# Patient Record
Sex: Male | Born: 1980 | Race: Asian | Hispanic: No | Marital: Married | Smoking: Never smoker
Health system: Southern US, Community
[De-identification: ages and names within clinical notes are randomized; demographics above are authoritative.]

---

## 2006-06-08 ENCOUNTER — Emergency Department (HOSPITAL_COMMUNITY): Admission: EM | Admit: 2006-06-08 | Discharge: 2006-06-08 | Payer: Self-pay | Admitting: Emergency Medicine

## 2009-11-14 ENCOUNTER — Emergency Department (HOSPITAL_COMMUNITY): Admission: EM | Admit: 2009-11-14 | Discharge: 2009-11-14 | Payer: Self-pay | Admitting: Emergency Medicine

## 2019-01-08 ENCOUNTER — Other Ambulatory Visit: Payer: Self-pay | Admitting: *Deleted

## 2019-01-08 DIAGNOSIS — Z20822 Contact with and (suspected) exposure to covid-19: Secondary | ICD-10-CM

## 2019-01-09 ENCOUNTER — Other Ambulatory Visit: Payer: Self-pay

## 2019-01-09 ENCOUNTER — Encounter (HOSPITAL_COMMUNITY): Payer: Self-pay

## 2019-01-09 ENCOUNTER — Emergency Department (HOSPITAL_COMMUNITY): Payer: HRSA Program

## 2019-01-09 ENCOUNTER — Inpatient Hospital Stay (HOSPITAL_COMMUNITY)
Admission: EM | Admit: 2019-01-09 | Discharge: 2019-01-16 | DRG: 177 | Disposition: A | Discharge status: Home / Self Care | Payer: HRSA Program | Attending: Internal Medicine | Admitting: Internal Medicine

## 2019-01-09 DIAGNOSIS — R6510 Systemic inflammatory response syndrome (SIRS) of non-infectious origin without acute organ dysfunction: Secondary | ICD-10-CM | POA: Diagnosis present

## 2019-01-09 DIAGNOSIS — A419 Sepsis, unspecified organism: Secondary | ICD-10-CM

## 2019-01-09 DIAGNOSIS — A4189 Other specified sepsis: Secondary | ICD-10-CM | POA: Diagnosis present

## 2019-01-09 DIAGNOSIS — R0602 Shortness of breath: Secondary | ICD-10-CM | POA: Diagnosis not present

## 2019-01-09 DIAGNOSIS — J189 Pneumonia, unspecified organism: Secondary | ICD-10-CM

## 2019-01-09 DIAGNOSIS — R739 Hyperglycemia, unspecified: Secondary | ICD-10-CM | POA: Diagnosis present

## 2019-01-09 DIAGNOSIS — J9601 Acute respiratory failure with hypoxia: Secondary | ICD-10-CM | POA: Diagnosis present

## 2019-01-09 DIAGNOSIS — E876 Hypokalemia: Secondary | ICD-10-CM | POA: Diagnosis present

## 2019-01-09 DIAGNOSIS — R05 Cough: Secondary | ICD-10-CM

## 2019-01-09 DIAGNOSIS — J1289 Other viral pneumonia: Secondary | ICD-10-CM | POA: Diagnosis present

## 2019-01-09 DIAGNOSIS — U071 COVID-19: Principal | ICD-10-CM | POA: Diagnosis present

## 2019-01-09 DIAGNOSIS — R059 Cough, unspecified: Secondary | ICD-10-CM

## 2019-01-09 LAB — COMPREHENSIVE METABOLIC PANEL
ALT: 45 U/L — ABNORMAL HIGH (ref 0–44)
AST: 66 U/L — ABNORMAL HIGH (ref 15–41)
Albumin: 3.4 g/dL — ABNORMAL LOW (ref 3.5–5.0)
Alkaline Phosphatase: 26 U/L — ABNORMAL LOW (ref 38–126)
Anion gap: 13 (ref 5–15)
BUN: 10 mg/dL (ref 6–20)
CO2: 21 mmol/L — ABNORMAL LOW (ref 22–32)
Calcium: 8.4 mg/dL — ABNORMAL LOW (ref 8.9–10.3)
Chloride: 100 mmol/L (ref 98–111)
Creatinine, Ser: 1.23 mg/dL (ref 0.61–1.24)
GFR calc Af Amer: >60 mL/min (ref >60)
GFR calc non Af Amer: >60 mL/min (ref >60)
Glucose, Bld: 215 mg/dL — ABNORMAL HIGH (ref 70–99)
Potassium: 2.9 mmol/L — ABNORMAL LOW (ref 3.5–5.1)
Sodium: 134 mmol/L — ABNORMAL LOW (ref 135–145)
Total Bilirubin: 0.6 mg/dL (ref 0.3–1.2)
Total Protein: 7.7 g/dL (ref 6.5–8.1)

## 2019-01-09 LAB — CBC WITH DIFFERENTIAL/PLATELET
Abs Immature Granulocytes: 0.09 10*3/uL — ABNORMAL HIGH (ref 0.00–0.07)
Basophils Absolute: 0 10*3/uL (ref 0.0–0.1)
Basophils Relative: 0 %
Eosinophils Absolute: 0 10*3/uL (ref 0.0–0.5)
Eosinophils Relative: 0 %
HCT: 43.2 % (ref 39.0–52.0)
Hemoglobin: 14.1 g/dL (ref 13.0–17.0)
Immature Granulocytes: 1 %
Lymphocytes Relative: 8 %
Lymphs Abs: 0.9 10*3/uL (ref 0.7–4.0)
MCH: 26.3 pg (ref 26.0–34.0)
MCHC: 32.6 g/dL (ref 30.0–36.0)
MCV: 80.4 fL (ref 80.0–100.0)
Monocytes Absolute: 0.7 10*3/uL (ref 0.1–1.0)
Monocytes Relative: 6 %
Neutro Abs: 10.2 10*3/uL — ABNORMAL HIGH (ref 1.7–7.7)
Neutrophils Relative %: 85 %
Platelets: 185 10*3/uL (ref 150–400)
RBC: 5.37 MIL/uL (ref 4.22–5.81)
RDW: 13.2 % (ref 11.5–15.5)
WBC: 12 10*3/uL — ABNORMAL HIGH (ref 4.0–10.5)
nRBC: 0 % (ref 0.0–0.2)

## 2019-01-09 LAB — C-REACTIVE PROTEIN: CRP: 12.9 mg/dL — ABNORMAL HIGH (ref <1.0)

## 2019-01-09 LAB — URINALYSIS, ROUTINE W REFLEX MICROSCOPIC
Bilirubin Urine: NEGATIVE
Glucose, UA: NEGATIVE mg/dL
Hgb urine dipstick: NEGATIVE
Ketones, ur: NEGATIVE mg/dL
Leukocytes,Ua: NEGATIVE
Nitrite: NEGATIVE
Protein, ur: 100 mg/dL — AB
Specific Gravity, Urine: 1.019 (ref 1.005–1.030)
pH: 7 (ref 5.0–8.0)

## 2019-01-09 LAB — LACTATE DEHYDROGENASE: LDH: 359 U/L — ABNORMAL HIGH (ref 98–192)

## 2019-01-09 LAB — FIBRINOGEN: Fibrinogen: 785 mg/dL — ABNORMAL HIGH (ref 210–475)

## 2019-01-09 LAB — D-DIMER, QUANTITATIVE: D-Dimer, Quant: 0.43 ug/mL-FEU (ref 0.00–0.50)

## 2019-01-09 LAB — LACTIC ACID, PLASMA: Lactic Acid, Venous: 2 mmol/L (ref 0.5–1.9)

## 2019-01-09 LAB — PROTIME-INR
INR: 1 (ref 0.8–1.2)
Prothrombin Time: 13.3 seconds (ref 11.4–15.2)

## 2019-01-09 LAB — TRIGLYCERIDES: Triglycerides: 116 mg/dL (ref <150)

## 2019-01-09 MED ORDER — ACETAMINOPHEN 325 MG PO TABS
650.0000 mg | ORAL_TABLET | Freq: Once | ORAL | Status: AC | PRN
  Administered 2019-01-09: 650 mg via ORAL
  Filled 2019-01-09 (×2): qty 2

## 2019-01-09 MED ORDER — CEFTRIAXONE SODIUM 2 G IJ SOLR
2.0000 g | INTRAMUSCULAR | Status: AC
  Administered 2019-01-09 – 2019-01-13 (×5): 2 g via INTRAVENOUS
  Filled 2019-01-09 (×2): qty 2
  Filled 2019-01-09: qty 20
  Filled 2019-01-09: qty 2
  Filled 2019-01-09 (×2): qty 20

## 2019-01-09 MED ORDER — SODIUM CHLORIDE 0.9 % IV SOLN
500.0000 mg | INTRAVENOUS | Status: AC
  Administered 2019-01-09 – 2019-01-13 (×5): 500 mg via INTRAVENOUS
  Filled 2019-01-09 (×5): qty 500

## 2019-01-09 MED ORDER — POTASSIUM CHLORIDE 10 MEQ/100ML IV SOLN
10.0000 meq | Freq: Once | INTRAVENOUS | Status: AC
  Administered 2019-01-10: 10 meq via INTRAVENOUS
  Filled 2019-01-09: qty 100

## 2019-01-09 NOTE — ED Triage Notes (Signed)
Pt reports chest pain, Sob and cough. Pt does not speak much english and the interpreter does not have his language. resp e/u at this time. Temp 103 in triage

## 2019-01-09 NOTE — ED Provider Notes (Signed)
[facility] EMERGENCY DEPARTMENT Provider Note   CSN: [id number] Arrival date & time: 01/09/19  2135    History   Chief Complaint Chief Complaint  Patient presents with  . Shortness of Breath  . Chest Pain  . Cough    HPI Patrick Bryan is a 38 y.o. male with no significant past medical history presenting with constant shortness of breath, palpitations, sore throat, and dry cough onset 1 week ago. Patient is originally from [place] in [place]. Patient states symptoms have worsened today and he started to have a subjective fever today. Patient reports chills, diaphoresis, and fatigue. Patient states nothing makes symptoms better or worse. Patient states he has taken Advil without relief. Patient states he last took Advil at 7pm. Patient reports 2 sick contacts at work. Patient states he works for a [employer]. Patient reports travel to [place] for work. Patient reports one episode of non bilious non bloody vomiting prior to arrival today. Patient reports intermittent non bloody diarrhea. Patient denies abdominal pain, congestion, or chest pain.      HPI  History reviewed. No pertinent past medical history.  There are no active problems to display for this patient.   History reviewed. No pertinent surgical history.      Home Medications    Prior to Admission medications   Not on File    Family History No family history on file.  Social History Social History   Tobacco Use  . Smoking status: Not on file  Substance Use Topics  . Alcohol use: Not on file  . Drug use: Not on file     Allergies   Patient has no allergy information on record.   Review of Systems Review of Systems  Constitutional: Positive for chills, diaphoresis, fatigue and fever. Negative for unexpected weight change.  HENT: Positive for sore throat. Negative for congestion, rhinorrhea and trouble swallowing.   Eyes: Negative for visual disturbance.  Respiratory: Positive for  cough and shortness of breath. Negative for chest tightness, wheezing and stridor.   Cardiovascular: Positive for palpitations. Negative for chest pain and leg swelling.  Gastrointestinal: Positive for diarrhea and vomiting. Negative for abdominal pain, blood in stool and nausea.  Endocrine: Negative for cold intolerance and heat intolerance.  Genitourinary: Negative for dysuria and frequency.  Musculoskeletal: Negative for myalgias.  Skin: Negative for pallor, rash and wound.  Allergic/Immunologic: Negative for environmental allergies, food allergies and immunocompromised state.  Neurological: Negative for dizziness, syncope, speech difficulty, weakness, light-headedness and numbness.  Psychiatric/Behavioral: The patient is not nervous/anxious.      Physical Exam Updated Vital Signs BP 124/78   Pulse 99   Temp (!) 103.1 F (39.5 C) (Oral)   Resp (!) 27   Ht 5\' 5"  (1.651 m)   Wt 81.6 kg   SpO2 99%   BMI 29.95 kg/m   Physical Exam Vitals signs and nursing note reviewed.  Constitutional:      General: He is not in acute distress.    Appearance: He is well-developed. He is not ill-appearing or diaphoretic.     Comments: Patient is sitting up in bed in no acute distress at this time.   HENT:     Head: Normocephalic and atraumatic.     Mouth/Throat:     Pharynx: Uvula midline. Posterior oropharyngeal erythema present. No oropharyngeal exudate or uvula swelling.  Neck:     Musculoskeletal: Normal range of motion and neck supple.  Cardiovascular:     Rate and Rhythm: Regular  rhythm. Tachycardia present.     Heart sounds: Normal heart sounds. No murmur. No friction rub. No gallop.   Pulmonary:     Effort: Pulmonary effort is normal. Tachypnea present. No accessory muscle usage or respiratory distress.     Breath sounds: Examination of the right-upper field reveals rales. Examination of the left-upper field reveals rales. Examination of the right-middle field reveals rales.  Examination of the left-middle field reveals rales. Examination of the right-lower field reveals rales. Examination of the left-lower field reveals rales. Rales present. No decreased breath sounds, wheezing or rhonchi.  Abdominal:     Palpations: Abdomen is soft.     Tenderness: There is no abdominal tenderness.  Musculoskeletal: Normal range of motion.     Right lower leg: He exhibits no tenderness. No edema.     Left lower leg: He exhibits no tenderness. No edema.  Skin:    General: Skin is warm.     Findings: No erythema or rash.  Neurological:     Mental Status: He is alert.    ED Treatments / Results  Labs (all labs ordered are listed, but only abnormal results are displayed) Labs Reviewed  SARS CORONAVIRUS 2 (HOSPITAL ORDER, PERFORMED IN [facility]) - Abnormal; Notable for the following components:      Result Value   SARS Coronavirus 2 POSITIVE (*)    All other components within normal limits  COMPREHENSIVE METABOLIC PANEL - Abnormal; Notable for the following components:   Sodium 134 (*)    Potassium 2.9 (*)    CO2 21 (*)    Glucose, Bld 215 (*)    Calcium 8.4 (*)    Albumin 3.4 (*)    AST 66 (*)    ALT 45 (*)    Alkaline Phosphatase 26 (*)    All other components within normal limits  LACTIC ACID, PLASMA - Abnormal; Notable for the following components:   Lactic Acid, Venous 2.0 (*)    All other components within normal limits  CBC WITH DIFFERENTIAL/PLATELET - Abnormal; Notable for the following components:   WBC 12.0 (*)    Neutro Abs 10.2 (*)    Abs Immature Granulocytes 0.09 (*)    All other components within normal limits  URINALYSIS, ROUTINE W REFLEX MICROSCOPIC - Abnormal; Notable for the following components:   Protein, ur 100 (*)    Bacteria, UA RARE (*)    All other components within normal limits  LACTATE DEHYDROGENASE - Abnormal; Notable for the following components:   LDH 359 (*)    All other components within normal limits  FERRITIN  - Abnormal; Notable for the following components:   Ferritin 1,197 (*)    All other components within normal limits  FIBRINOGEN - Abnormal; Notable for the following components:   Fibrinogen 785 (*)    All other components within normal limits  C-REACTIVE PROTEIN - Abnormal; Notable for the following components:   CRP 12.9 (*)    All other components within normal limits  CULTURE, BLOOD (ROUTINE X 2)  CULTURE, BLOOD (ROUTINE X 2)  PROTIME-INR  D-DIMER, QUANTITATIVE (NOT AT [facility])  PROCALCITONIN  TRIGLYCERIDES  TROPONIN I  LACTIC ACID, PLASMA    EKG EKG Interpretation  Date/Time:  Sunday January 09 2019 22:58:48 EDT Ventricular Rate:  116 PR Interval:    QRS Duration: 91 QT Interval:  278 QTC Calculation: 387 R Axis:   -41 Text Interpretation:  Sinus tachycardia Probable left atrial enlargement Left axis deviation Abnormal R-wave progression,  late transition Baseline wander in lead(s) V1 V2 Confirmed by Blane OharaZavitz, Joshua [id number]) on 01/09/2019 11:01:53 PM   Radiology Dg Chest Portable 1 View  Result Date: 01/09/2019 CLINICAL DATA:  38 year old male with history of chest pain, shortness of breath and cough. EXAM: PORTABLE CHEST 1 VIEW COMPARISON:  No priors. FINDINGS: Lung volumes are low. Patchy ill-defined airspace opacities in interstitial prominence throughout the mid to lower lungs bilaterally, concerning for multilobar pneumonia. No definite pleural effusions. No evidence of pulmonary edema. Heart size is normal. IMPRESSION: 1. Findings concerning for multilobar pneumonia. Electronically Signed   By: Trudie Reedaniel  Entrikin M.D.   On: 01/09/2019 22:17    Procedures Procedures (including critical care time)  Medications Ordered in ED Medications  cefTRIAXone (ROCEPHIN) 2 g in sodium chloride 0.9 % 100 mL IVPB (0 g Intravenous Stopped 01/09/19 2351)  azithromycin (ZITHROMAX) 500 mg in sodium chloride 0.9 % 250 mL IVPB (500 mg Intravenous New Bag/Given 01/09/19 2352)  potassium chloride 10  mEq in 100 mL IVPB (10 mEq Intravenous New Bag/Given 01/10/19 0003)  acetaminophen (TYLENOL) tablet 650 mg (650 mg Oral Given 01/09/19 2313)     Initial Impression / Assessment and Plan / ED Course  I have reviewed the triage vital signs and the nursing notes.  Pertinent labs & imaging results that were available during my care of the patient were reviewed by me and considered in my medical decision making (see chart for details).  Clinical Course as of Jan 09 101  Wynelle LinkSun Jan 09, 2019  2227 Findings concerning for multilobar pneumonia noted on CXR.  DG Chest Portable 1 View [AH]  2302 Leukocytosis noted at 12.   WBC(!): 12.0 [AH]  2302 Lactic acid elevated at 2.0.  Lactic Acid, Venous(!!): 2.0 [AH]  2320 Hypokalemia noted at 2.9. Will provide potassium.  Potassium(!): 2.9 [AH]    Clinical Course User Index [AH] Leretha DykesHernandez, Arlie Riker P, PA-C      Patient presents with cough, shortness of breath, fever, and palpitations. Patient is febrile, tachypneic, and tachycardic. CXR reveals multilobar pneumonia. Code sepsis was activated. Antibiotics given for CAP. Patient has had sick contacts with similar symptoms. COVID-19 testing and labs ordered. COVID-19 test is positive. Patient will need admission for sepsis, multilobar pneumonia, and COVID-19. Consulted hospitalist for admission. Hospitalist has agreed to admit patient.   Findings and plan of care discussed with supervising physician Dr. Nicanor AlconPalumbo.   CRITICAL CARE Performed by: Estle Huguley P Dov Dill  Total critical care time: 33 minutes  Critical care time was exclusive of separately billable procedures and treating other patients.  Critical care was necessary to treat or prevent imminent or life-threatening deterioration.  Critical care was time spent personally by me on the following activities: development of treatment plan with patient and/or surrogate as well as nursing, discussions with consultants, evaluation of patient's response to treatment,  examination of patient, obtaining history from patient or surrogate, ordering and performing treatments and interventions, ordering and review of laboratory studies, ordering and review of radiographic studies, pulse oximetry and re-evaluation of patient's condition.  Patrick Bryan was evaluated in Emergency Department on 01/10/2019 for the symptoms described in the history of present illness. He was evaluated in the context of the global COVID-19 pandemic, which necessitated consideration that the patient might be at risk for infection with the SARS-CoV-2 virus that causes COVID-19. Institutional protocols and algorithms that pertain to the evaluation of patients at risk for COVID-19 are in a state of rapid change based on information released by regulatory  bodies including the CDC and federal and state organizations. These policies and algorithms were followed during the patient's care in the ED.   Final Clinical Impressions(s) / ED Diagnoses   Final diagnoses:  Community acquired pneumonia, unspecified laterality  Shortness of breath  Cough  COVID-19 virus detected  Sepsis, due to unspecified organism, unspecified whether acute organ dysfunction present [facility](HCC)    ED Discharge Orders    None       Leretha DykesHernandez, Allen Egerton P, [place] JerseyPA-C 01/10/19 0103    Palumbo, April, MD 01/10/19 0132

## 2019-01-10 ENCOUNTER — Encounter (HOSPITAL_COMMUNITY): Payer: Self-pay | Admitting: Internal Medicine

## 2019-01-10 DIAGNOSIS — A419 Sepsis, unspecified organism: Secondary | ICD-10-CM | POA: Diagnosis not present

## 2019-01-10 DIAGNOSIS — J9601 Acute respiratory failure with hypoxia: Secondary | ICD-10-CM | POA: Diagnosis not present

## 2019-01-10 DIAGNOSIS — J1282 Pneumonia due to coronavirus disease 2019: Secondary | ICD-10-CM | POA: Diagnosis present

## 2019-01-10 DIAGNOSIS — E876 Hypokalemia: Secondary | ICD-10-CM | POA: Diagnosis present

## 2019-01-10 DIAGNOSIS — R740 Nonspecific elevation of levels of transaminase and lactic acid dehydrogenase [LDH]: Secondary | ICD-10-CM | POA: Diagnosis not present

## 2019-01-10 DIAGNOSIS — R0602 Shortness of breath: Secondary | ICD-10-CM | POA: Diagnosis present

## 2019-01-10 DIAGNOSIS — U071 COVID-19: Secondary | ICD-10-CM | POA: Diagnosis not present

## 2019-01-10 DIAGNOSIS — R6510 Systemic inflammatory response syndrome (SIRS) of non-infectious origin without acute organ dysfunction: Secondary | ICD-10-CM | POA: Diagnosis present

## 2019-01-10 DIAGNOSIS — J1289 Other viral pneumonia: Secondary | ICD-10-CM | POA: Diagnosis not present

## 2019-01-10 DIAGNOSIS — R739 Hyperglycemia, unspecified: Secondary | ICD-10-CM

## 2019-01-10 DIAGNOSIS — J9621 Acute and chronic respiratory failure with hypoxia: Secondary | ICD-10-CM | POA: Diagnosis not present

## 2019-01-10 DIAGNOSIS — A4189 Other specified sepsis: Secondary | ICD-10-CM | POA: Diagnosis not present

## 2019-01-10 LAB — CBC WITH DIFFERENTIAL/PLATELET
Abs Immature Granulocytes: 0.04 10*3/uL (ref 0.00–0.07)
Abs Immature Granulocytes: 0.08 10*3/uL — ABNORMAL HIGH (ref 0.00–0.07)
Basophils Absolute: 0 10*3/uL (ref 0.0–0.1)
Basophils Absolute: 0 10*3/uL (ref 0.0–0.1)
Basophils Relative: 0 %
Basophils Relative: 0 %
Eosinophils Absolute: 0 10*3/uL (ref 0.0–0.5)
Eosinophils Absolute: 0 10*3/uL (ref 0.0–0.5)
Eosinophils Relative: 1 %
Eosinophils Relative: 0 %
HCT: 47.7 % (ref 39.0–52.0)
HCT: 41.4 % (ref 39.0–52.0)
Hemoglobin: 15.7 g/dL (ref 13.0–17.0)
Hemoglobin: 13.5 g/dL (ref 13.0–17.0)
Immature Granulocytes: 1 %
Immature Granulocytes: 1 %
Lymphocytes Relative: 21 %
Lymphocytes Relative: 6 %
Lymphs Abs: 0.8 10*3/uL (ref 0.7–4.0)
Lymphs Abs: 0.6 10*3/uL — ABNORMAL LOW (ref 0.7–4.0)
MCH: 26.2 pg (ref 26.0–34.0)
MCH: 26.4 pg (ref 26.0–34.0)
MCHC: 32.9 g/dL (ref 30.0–36.0)
MCHC: 32.6 g/dL (ref 30.0–36.0)
MCV: 79.6 fL — ABNORMAL LOW (ref 80.0–100.0)
MCV: 81 fL (ref 80.0–100.0)
Monocytes Absolute: 0.2 10*3/uL (ref 0.1–1.0)
Monocytes Absolute: 0.7 10*3/uL (ref 0.1–1.0)
Monocytes Relative: 5 %
Monocytes Relative: 6 %
Neutro Abs: 2.6 10*3/uL (ref 1.7–7.7)
Neutro Abs: 9.1 10*3/uL — ABNORMAL HIGH (ref 1.7–7.7)
Neutrophils Relative %: 72 %
Neutrophils Relative %: 87 %
Platelets: 5 10*3/uL — CL (ref 150–400)
Platelets: 185 10*3/uL (ref 150–400)
RBC: 5.99 MIL/uL — ABNORMAL HIGH (ref 4.22–5.81)
RBC: 5.11 MIL/uL (ref 4.22–5.81)
RDW: 13.3 % (ref 11.5–15.5)
RDW: 13.5 % (ref 11.5–15.5)
WBC Morphology: INCREASED
WBC: 3.6 10*3/uL — ABNORMAL LOW (ref 4.0–10.5)
WBC: 10.5 10*3/uL (ref 4.0–10.5)
nRBC: 0 % (ref 0.0–0.2)
nRBC: 0 % (ref 0.0–0.2)

## 2019-01-10 LAB — BASIC METABOLIC PANEL
Anion gap: 10 (ref 5–15)
BUN: 11 mg/dL (ref 6–20)
CO2: 24 mmol/L (ref 22–32)
Calcium: 8.3 mg/dL — ABNORMAL LOW (ref 8.9–10.3)
Chloride: 103 mmol/L (ref 98–111)
Creatinine, Ser: 1.03 mg/dL (ref 0.61–1.24)
GFR calc Af Amer: >60 mL/min (ref >60)
GFR calc non Af Amer: >60 mL/min (ref >60)
Glucose, Bld: 118 mg/dL — ABNORMAL HIGH (ref 70–99)
Potassium: 4.1 mmol/L (ref 3.5–5.1)
Sodium: 137 mmol/L (ref 135–145)

## 2019-01-10 LAB — DIC (DISSEMINATED INTRAVASCULAR COAGULATION)PANEL
D-Dimer, Quant: 0.42 ug/mL-FEU (ref 0.00–0.50)
Fibrinogen: >800 mg/dL — ABNORMAL HIGH (ref 210–475)
INR: 1 (ref 0.8–1.2)
Platelets: 197 10*3/uL (ref 150–400)
Prothrombin Time: 13.3 seconds (ref 11.4–15.2)
Smear Review: NONE SEEN
aPTT: 35 seconds (ref 24–36)

## 2019-01-10 LAB — LACTATE DEHYDROGENASE: LDH: 359 U/L — ABNORMAL HIGH (ref 98–192)

## 2019-01-10 LAB — NOVEL CORONAVIRUS, NAA: SARS-CoV-2, NAA: DETECTED — AB

## 2019-01-10 LAB — LACTIC ACID, PLASMA
Lactic Acid, Venous: 2.7 mmol/L (ref 0.5–1.9)
Lactic Acid, Venous: 3.3 mmol/L (ref 0.5–1.9)
Lactic Acid, Venous: 1.3 mmol/L (ref 0.5–1.9)
Lactic Acid, Venous: 1.9 mmol/L (ref 0.5–1.9)

## 2019-01-10 LAB — TROPONIN I: Troponin I: <0.03 ng/mL (ref <0.03)

## 2019-01-10 LAB — C-REACTIVE PROTEIN: CRP: 13.3 mg/dL — ABNORMAL HIGH (ref <1.0)

## 2019-01-10 LAB — PROCALCITONIN
Procalcitonin: 0.2 ng/mL
Procalcitonin: 3.41 ng/mL

## 2019-01-10 LAB — GLUCOSE, CAPILLARY
Glucose-Capillary: 128 mg/dL — ABNORMAL HIGH (ref 70–99)
Glucose-Capillary: 149 mg/dL — ABNORMAL HIGH (ref 70–99)
Glucose-Capillary: 160 mg/dL — ABNORMAL HIGH (ref 70–99)

## 2019-01-10 LAB — SARS CORONAVIRUS 2 BY RT PCR (HOSPITAL ORDER, PERFORMED IN ~~LOC~~ HOSPITAL LAB): SARS Coronavirus 2: POSITIVE — AB

## 2019-01-10 LAB — TECHNOLOGIST SMEAR REVIEW

## 2019-01-10 LAB — FERRITIN
Ferritin: 1197 ng/mL — ABNORMAL HIGH (ref 24–336)
Ferritin: 1059 ng/mL — ABNORMAL HIGH (ref 24–336)

## 2019-01-10 MED ORDER — ACETAMINOPHEN 325 MG PO TABS
650.0000 mg | ORAL_TABLET | Freq: Four times a day (QID) | ORAL | Status: DC | PRN
  Administered 2019-01-10: 650 mg via ORAL
  Filled 2019-01-10: qty 2

## 2019-01-10 MED ORDER — ENOXAPARIN SODIUM 40 MG/0.4ML ~~LOC~~ SOLN: 40.0000 mg | Freq: Every day | SUBCUTANEOUS | Status: DC

## 2019-01-10 MED ORDER — ONDANSETRON HCL 4 MG/2ML IJ SOLN: 4.0000 mg | Freq: Four times a day (QID) | INTRAMUSCULAR | Status: DC | PRN

## 2019-01-10 MED ORDER — POTASSIUM CHLORIDE CRYS ER 20 MEQ PO TBCR
40.0000 meq | EXTENDED_RELEASE_TABLET | Freq: Once | ORAL | Status: AC
  Administered 2019-01-10: 40 meq via ORAL
  Filled 2019-01-10: qty 2

## 2019-01-10 MED ORDER — ACETAMINOPHEN 650 MG RE SUPP: 650.0000 mg | Freq: Four times a day (QID) | RECTAL | Status: DC | PRN

## 2019-01-10 MED ORDER — METHYLPREDNISOLONE SODIUM SUCC 40 MG IJ SOLR
40.0000 mg | Freq: Three times a day (TID) | INTRAMUSCULAR | Status: DC
  Administered 2019-01-10 – 2019-01-14 (×12): 40 mg via INTRAVENOUS
  Filled 2019-01-10 (×12): qty 1

## 2019-01-10 MED ORDER — INSULIN ASPART 100 UNIT/ML ~~LOC~~ SOLN
0.0000 [IU] | Freq: Three times a day (TID) | SUBCUTANEOUS | Status: DC
  Administered 2019-01-10 – 2019-01-11 (×4): 1 [IU] via SUBCUTANEOUS
  Administered 2019-01-11 – 2019-01-12 (×2): 2 [IU] via SUBCUTANEOUS
  Administered 2019-01-12: 1 [IU] via SUBCUTANEOUS
  Administered 2019-01-12: 2 [IU] via SUBCUTANEOUS
  Administered 2019-01-13 (×2): 1 [IU] via SUBCUTANEOUS
  Administered 2019-01-13 – 2019-01-14 (×3): 2 [IU] via SUBCUTANEOUS
  Administered 2019-01-14: 1 [IU] via SUBCUTANEOUS
  Administered 2019-01-15 (×2): 2 [IU] via SUBCUTANEOUS

## 2019-01-10 MED ORDER — SODIUM CHLORIDE 0.9 % IV SOLN
INTRAVENOUS | Status: DC
  Administered 2019-01-10 – 2019-01-13 (×5): via INTRAVENOUS

## 2019-01-10 MED ORDER — SODIUM CHLORIDE 0.9 % IV SOLN
200.0000 mg | Freq: Once | INTRAVENOUS | Status: AC
  Administered 2019-01-10: 200 mg via INTRAVENOUS
  Filled 2019-01-10: qty 40

## 2019-01-10 MED ORDER — SODIUM CHLORIDE 0.9 % IV SOLN
100.0000 mg | INTRAVENOUS | Status: DC
  Administered 2019-01-11 – 2019-01-14 (×4): 100 mg via INTRAVENOUS
  Filled 2019-01-10 (×5): qty 20

## 2019-01-10 MED ORDER — ONDANSETRON HCL 4 MG PO TABS: 4.0000 mg | ORAL_TABLET | Freq: Four times a day (QID) | ORAL | Status: DC | PRN

## 2019-01-10 NOTE — ED Notes (Signed)
Pt denies CP at this time 

## 2019-01-10 NOTE — ED Provider Notes (Signed)
Shared service with APP.  I have personally seen and examined the patient, providing direct face to face care.  Physical exam findings and plan include fever, shortness of breath, tachypnea.  Concern for covid/ bacterial pneumonia.   IV abx, COVID, labs, cxr results reviewed.   1. Community acquired pneumonia, unspecified laterality   2. Shortness of breath   3. Cough       Elnora Morrison, MD 01/10/19 2356

## 2019-01-10 NOTE — Progress Notes (Signed)
Pharmacy Antibiotic Note  Patrick Bryan is a 38 y.o. male admitted on 01/09/2019 with COVID-19.  Pharmacy has been consulted for Remdesivir dosing.  Pt sat in low 90s on RA. Was on 2L [place] yesterday. ALT 45 CXR shows multilobar PNA   Plan: Remdesivir 200mg  now then 100mg  daily x 4 days Will f/u LFTs  Height: 5\' 5"  (165.1 cm) Weight: 180 lb (81.6 kg) IBW/kg (Calculated) : 61.5  Temp (24hrs), Avg:100.4 F (38 C), Min:99 F (37.2 C), Max:103.1 F (39.5 C)  Recent Labs  Lab 01/09/19 2208 01/09/19 2209 01/09/19 2350 01/10/19 0702  WBC 12.0*  --   --   --   CREATININE 1.23  --   --   --   LATICACIDVEN  --  2.0* 2.7* 3.3*    Estimated Creatinine Clearance: 81.6 mL/min (by C-G formula based on SCr of 1.23 mg/dL).    Not on File  Antimicrobials this admission: 6/8 Remdesivir >> 6/12  Microbiology results: 6/7 BCx:  6/7 SARS coronavirus 2: positive  Thank you for allowing pharmacy to be a part of this patient's care.  Sherlon Handing, PharmD, BCPS Clinical pharmacist  **Pharmacist phone directory can now be found on [email or web address] ([email or web address]).  Listed under [place]. 01/10/2019 9:36 AM

## 2019-01-10 NOTE — ED Notes (Signed)
ED TO INPATIENT HANDOFF REPORT  ED Nurse Name and Phone #:  Patty [phone]  S Name/Age/Gender Patrick Bryan 38 y.o. male Room/Bed: 018C/018C  Code Status   Code Status: Not on file  Home/SNF/Other Home Patient oriented to: self, place, time and situation Is this baseline? Yes   Triage Complete: Triage complete  Chief Complaint chest pain,sob  Triage Note Pt reports chest pain, Sob and cough. Pt does not speak much english and the interpreter does not have his language. resp e/u at this time. Temp 103 in triage   Allergies Not on File  Level of Care/Admitting Diagnosis ED Disposition    ED Disposition Condition Comment   Admit  The patient appears reasonably stabilized for admission considering the current resources, flow, and capabilities available in the ED at this time, and I doubt any other [facility] requiring further screening and/or treatment in the ED prior to admission is  present.       B Medical/Surgery History History reviewed. No pertinent past medical history. History reviewed. No pertinent surgical history.   A IV Location/Drains/Wounds Patient Lines/Drains/Airways Status   Active Line/Drains/Airways    Name:   Placement date:   Placement time:   Site:   Days:   Peripheral IV 01/09/19 Right Hand   01/09/19    2307    Hand   1   Peripheral IV 01/10/19 Left Hand   01/10/19    0000    Hand   less than 1          Intake/Output Last 24 hours  Intake/Output Summary (Last 24 hours) at 01/10/2019 16100137 Last data filed at 01/10/2019 0112 Gross per 24 hour  Intake 450 ml  Output -  Net 450 ml    Labs/Imaging Results for orders placed or performed during the hospital encounter of 01/09/19 (from the past 48 hour(s))  Urinalysis, Routine w reflex microscopic     Status: Abnormal   Collection Time: 01/09/19 10:00 PM  Result Value Ref Range   Color, Urine YELLOW YELLOW   APPearance CLEAR CLEAR   Specific Gravity, Urine 1.019 1.005 - 1.030   pH 7.0 5.0 - 8.0   Glucose, UA NEGATIVE NEGATIVE mg/dL   Hgb urine dipstick NEGATIVE NEGATIVE   Bilirubin Urine NEGATIVE NEGATIVE   Ketones, ur NEGATIVE NEGATIVE mg/dL   Protein, ur 960100 (A) NEGATIVE mg/dL   Nitrite NEGATIVE NEGATIVE   Leukocytes,Ua NEGATIVE NEGATIVE   RBC / HPF 0-5 0 - 5 RBC/hpf   WBC, UA 0-5 0 - 5 WBC/hpf   Bacteria, UA RARE (A) NONE SEEN   Mucus PRESENT     Comment: Performed at [facility], [address] [place], [place] [address]  Comprehensive metabolic panel     Status: Abnormal   Collection Time: 01/09/19 10:08 PM  Result Value Ref Range   Sodium 134 (L) 135 - 145 mmol/L   Potassium 2.9 (L) 3.5 - 5.1 mmol/L   Chloride 100 98 - 111 mmol/L   CO2 21 (L) 22 - 32 mmol/L   Glucose, Bld 215 (H) 70 - 99 mg/dL   BUN 10 6 - 20 mg/dL   Creatinine, Ser 8.111.23 0.61 - 1.24 mg/dL   Calcium 8.4 (L) 8.9 - 10.3 mg/dL   Total Protein 7.7 6.5 - 8.1 g/dL   Albumin 3.4 (L) 3.5 - 5.0 g/dL   AST 66 (H) 15 - 41 U/L   ALT 45 (H) 0 - 44 U/L   Alkaline Phosphatase 26 (L) 38 -  126 U/L   Total Bilirubin 0.6 0.3 - 1.2 mg/dL   GFR calc non Af Amer >60 >60 mL/min   GFR calc Af Amer >60 >60 mL/min   Anion gap 13 5 - 15    Comment: Performed at [facility], [address], [place], [place] [address]  CBC with Differential     Status: Abnormal   Collection Time: 01/09/19 10:08 PM  Result Value Ref Range   WBC 12.0 (H) 4.0 - 10.5 K/uL    Comment: REPEATED TO VERIFY WHITE COUNT CONFIRMED ON SMEAR    RBC 5.37 4.22 - 5.81 MIL/uL   Hemoglobin 14.1 13.0 - 17.0 g/dL   HCT 19.1 47.8 - 29.5 %   MCV 80.4 80.0 - 100.0 fL   MCH 26.3 26.0 - 34.0 pg   MCHC 32.6 30.0 - 36.0 g/dL   RDW 62.1 30.8 - 65.7 %   Platelets 185 150 - 400 K/uL   nRBC 0.0 0.0 - 0.2 %   Neutrophils Relative % 85 %   Neutro Abs 10.2 (H) 1.7 - 7.7 K/uL   Lymphocytes Relative 8 %   Lymphs Abs 0.9 0.7 - 4.0 K/uL   Monocytes Relative 6 %   Monocytes Absolute 0.7 0.1 - 1.0 K/uL   Eosinophils Relative 0 %   Eosinophils Absolute  0.0 0.0 - 0.5 K/uL   Basophils Relative 0 %   Basophils Absolute 0.0 0.0 - 0.1 K/uL   Immature Granulocytes 1 %   Abs Immature Granulocytes 0.09 (H) 0.00 - 0.07 K/uL    Comment: Performed at [facility], [address] [place], [place] [address]  Protime-INR     Status: None   Collection Time: 01/09/19 10:08 PM  Result Value Ref Range   Prothrombin Time 13.3 11.4 - 15.2 seconds   INR 1.0 0.8 - 1.2    Comment: (NOTE) INR goal varies based on device and disease states. Performed at [facility], [address] [place], [place] [address]   D-dimer, quantitative     Status: None   Collection Time: 01/09/19 10:08 PM  Result Value Ref Range   D-Dimer, Quant 0.43 0.00 - 0.50 ug/mL-FEU    Comment: (NOTE) At the manufacturer cut-off of 0.50 ug/mL FEU, this assay has been documented to exclude PE with a sensitivity and negative predictive value of 97 to 99%.  At this time, this assay has not been approved by the FDA to exclude DVT/VTE. Results should be correlated with clinical presentation. Performed at [facility], [address], [place], [place] [address]   Procalcitonin     Status: None   Collection Time: 01/09/19 10:08 PM  Result Value Ref Range   Procalcitonin 0.20 ng/mL    Comment:        Interpretation: PCT (Procalcitonin) <= 0.5 ng/mL: Systemic infection (sepsis) is not likely. Local bacterial infection is possible. (NOTE)       Sepsis PCT Algorithm           Lower Respiratory Tract                                      Infection PCT Algorithm    ----------------------------     ----------------------------         PCT < 0.25 ng/mL                PCT <  0.10 ng/mL         Strongly encourage             Strongly discourage   discontinuation of antibiotics    initiation of antibiotics    ----------------------------     -----------------------------       PCT 0.25 - 0.50 ng/mL            PCT 0.10 - 0.25 ng/mL               OR       >80% decrease in  PCT            Discourage initiation of                                            antibiotics      Encourage discontinuation           of antibiotics    ----------------------------     -----------------------------         PCT >= 0.50 ng/mL              PCT 0.26 - 0.50 ng/mL               AND        <80% decrease in PCT             Encourage initiation of                                             antibiotics       Encourage continuation           of antibiotics    ----------------------------     -----------------------------        PCT >= 0.50 ng/mL                  PCT > 0.50 ng/mL               AND         increase in PCT                  Strongly encourage                                      initiation of antibiotics    Strongly encourage escalation           of antibiotics                                     -----------------------------                                           PCT <= 0.25 ng/mL                                                 OR                                        >  80% decrease in PCT                                     Discontinue / Do not initiate                                             antibiotics Performed at [facility], [address] [place], [place] [address]   Lactate dehydrogenase     Status: Abnormal   Collection Time: 01/09/19 10:08 PM  Result Value Ref Range   LDH 359 (H) 98 - 192 U/L    Comment: Performed at [facility], [address] [place], [place] [address]  Ferritin     Status: Abnormal   Collection Time: 01/09/19 10:08 PM  Result Value Ref Range   Ferritin 1,197 (H) 24 - 336 ng/mL    Comment: Performed at [facility], [address] [place], [place] [address]  Triglycerides     Status: None   Collection Time: 01/09/19 10:08 PM  Result Value Ref Range   Triglycerides 116 <150 mg/dL    Comment: Performed at [facility], [address] [place], [place] [address]  Fibrinogen     Status:  Abnormal   Collection Time: 01/09/19 10:08 PM  Result Value Ref Range   Fibrinogen 785 (H) 210 - 475 mg/dL    Comment: Performed at [facility], [address] [place], [place] [address]  C-reactive protein     Status: Abnormal   Collection Time: 01/09/19 10:08 PM  Result Value Ref Range   CRP 12.9 (H) <1.0 mg/dL    Comment: Performed at [facility], [address] [place], [place] [address]  Troponin I - Once     Status: None   Collection Time: 01/09/19 10:08 PM  Result Value Ref Range   Troponin I <0.03 <0.03 ng/mL    Comment: Performed at [facility], [address] [place], [place] [address]  Lactic acid, plasma     Status: Abnormal   Collection Time: 01/09/19 10:09 PM  Result Value Ref Range   Lactic Acid, Venous 2.0 (HH) 0.5 - 1.9 mmol/L    Comment: CRITICAL RESULT CALLED TO, READ BACK BY AND VERIFIED WITH: CHRISCO C,RN 01/09/19 2300 WAYK Performed at [facility], [address] [place], [place] [address]   SARS Coronavirus 2 (CEPHEID- Performed in [facility] hospital lab), Hosp Order     Status: Abnormal   Collection Time: 01/09/19 11:15 PM  Result Value Ref Range   SARS Coronavirus 2 POSITIVE (A) NEGATIVE    Comment: RESULT CALLED TO, READ BACK BY AND VERIFIED WITH: P. Maleak Brazzel,RN 0051 01/10/2019 T. TYSOR (NOTE) If result is NEGATIVE SARS-CoV-2 target nucleic acids are NOT DETECTED. The SARS-CoV-2 RNA is generally detectable in upper and lower  respiratory specimens during the acute phase of infection. The lowest  concentration of SARS-CoV-2 viral copies this assay can detect is 250  copies / mL. A negative result does not preclude SARS-CoV-2 infection  and should not be used as the sole basis for treatment or other  patient management decisions.  A negative result may occur with  improper specimen collection / handling, submission of specimen other  than nasopharyngeal swab, presence  of viral mutation(s) within the  areas targeted by this  assay, and inadequate number of viral copies  (<250 copies / mL). A negative result must be combined with clinical  observations, patient history, and epidemiological information. If result is POSITIVE SARS-CoV-2 target nucleic acids are DETECTED.  The SARS-CoV-2 RNA is generally detectable in upper and lower  respiratory specimens during the acute phase of infection.  Positive  results are indicative of active infection with SARS-CoV-2.  Clinical  correlation with patient history and other diagnostic information is  necessary to determine patient infection status.  Positive results do  not rule out bacterial infection or co-infection with other viruses. If result is PRESUMPTIVE POSTIVE SARS-CoV-2 nucleic acids MAY BE PRESENT.   A presumptive positive result was obtained on the submitted specimen  and confirmed on repeat testing.  While 2019 novel coronavirus  (SARS-CoV-2) nucleic acids may be present in the submitted sample  additional confirmatory testing may be necessary for epidemiological  and / or clinical management purposes  to differentiate between  SARS-CoV-2 and other Sarbecovirus currently known to infect humans.  If clinically indicated additional testing with an alternate test  methodology [phone]([id number]) i s advised. The SARS-CoV-2 RNA is generally  detectable in upper and lower respiratory specimens during the acute  phase of infection. The expected result is Negative. Fact Sheet for Patients:  BoilerBrush.com.cyhttps://www.fda.gov/media/136312/download Fact Sheet for Healthcare Providers: https://pope.com/https://www.fda.gov/media/136313/download This test is not yet approved or cleared by the [place] FDA and has been authorized for detection and/or diagnosis of SARS-CoV-2 by FDA under an Emergency Use Authorization (EUA).  This EUA will remain in effect (meaning this test can be used) for the duration of the COVID-19 declaration under Section 564(b)(1) of the Act, 21 U.S.C. section 360bbb-3(b)(1),  unless the authorization is terminated or revoked sooner. Performed at [facility], [address] [place], [place] [address]    Dg Chest Portable 1 View  Result Date: 01/09/2019 CLINICAL DATA:  38 year old male with history of chest pain, shortness of breath and cough. EXAM: PORTABLE CHEST 1 VIEW COMPARISON:  No priors. FINDINGS: Lung volumes are low. Patchy ill-defined airspace opacities in interstitial prominence throughout the mid to lower lungs bilaterally, concerning for multilobar pneumonia. No definite pleural effusions. No evidence of pulmonary edema. Heart size is normal. IMPRESSION: 1. Findings concerning for multilobar pneumonia. Electronically Signed   By: Trudie Reedaniel  Entrikin M.D.   On: 01/09/2019 22:17    Pending Labs Unresulted Labs (From admission, onward)    Start     Ordered   01/09/19 2150  Lactic acid, plasma  Now then every 2 hours,   STAT     01/09/19 2150   01/09/19 2150  Culture, blood (Routine x 2)  BLOOD CULTURE X 2,   STAT     01/09/19 2150          Vitals/Pain Today's Vitals   01/10/19 0000 01/10/19 0100 01/10/19 0115 01/10/19 0118  BP:   113/83   Pulse: 99  84   Resp:  (!) 23    Temp:    99 F (37.2 C)  TempSrc:    Oral  SpO2: 99%  97%   Weight:      Height:        Isolation Precautions Droplet and Contact precautions  Medications Medications  cefTRIAXone (ROCEPHIN) 2 g in sodium chloride 0.9 % 100 mL IVPB (0 g Intravenous Stopped 01/09/19 2351)  azithromycin (ZITHROMAX) 500 mg in sodium chloride 0.9 % 250 mL  IVPB (0 mg Intravenous Stopped 01/10/19 0112)  acetaminophen (TYLENOL) tablet 650 mg (650 mg Oral Given 01/09/19 2313)  potassium chloride 10 mEq in 100 mL IVPB (0 mEq Intravenous Stopped 01/10/19 0112)    Mobility walks Low fall risk   Focused Assessments    R Recommendations: See Admitting Provider Note  Report given to:   Additional Notes:

## 2019-01-10 NOTE — Progress Notes (Signed)
Pt's temperature elevated to 103.2 at 0937. MD made aware. PRN Tylenol given. Temperature re-check was 98.8.  Cyrene Gharibian, Bing Neighbors, RN

## 2019-01-10 NOTE — ED Notes (Signed)
Breakfast ordered 

## 2019-01-10 NOTE — Progress Notes (Signed)
Critical platelets 5. Lab says questionable result since platelets yesterday were 185. MD made aware. Stat redraw ordered.   Freda Jaquith, Bing Neighbors, RN

## 2019-01-10 NOTE — Progress Notes (Signed)
PROGRESS NOTE                                                                                                                                                                                                             Patient Demographics:    Patrick Bryan, is a 38 y.o. male, DOB - 12/30/1980, NWG:[id number]  Admit date - 01/09/2019   Admitting Physician Eduard ClosArshad N Kakrakandy, MD  Outpatient Primary MD for the patient is Patient, No Pcp Per  LOS - 0   Chief Complaint  Patient presents with  . Shortness of Breath  . Chest Pain  . Cough       Brief Narrative    This is a no charge note as patient was admitted and seen earlier today by Dr. Toniann FailKakrakandy.   Subjective:    Patrick EvenerJon Judice today ports mild dyspnea, had fever earlier today at 103.2, reports some nausea, had vomiting yesterday .    Assessment  & Plan :    Principal Problem:   Pneumonia due to COVID-19 virus Active Problems:   SIRS (systemic inflammatory response syndrome) (HCC)   Hyperglycemia  Sepsis/acute hypoxic respiratory failure due to COVID 19 pneumonia -Is having elevated lactic acid, continue with Rocephin and azithromycin for now. -He is hypoxic 89% on room air, started on 2 L nasal cannula chest x-ray with bilateral pneumonia, so I have started on IV Solu-Medrol , and will start on Remdesivire. -There was elevated lactic acid, I will start on IV  fluid.  Hypokalemia I do not know exactly what is causing the hyperkalemia.  Replace recheck potassium check magnesium levels.    COVID-19 Labs  Recent Labs    01/09/19 2208 01/10/19 1035  DDIMER 0.43 PENDING  FERRITIN 1,197*  --   LDH 359* 359*  CRP 12.9* 13.3*    Lab Results  Component Value Date   SARSCOV2NAA POSITIVE (A) 01/09/2019     Code Status : Full  Family Communication  : Lovenox  Disposition Plan  : Home  Barriers For Discharge : on IV remdesivir, IV fluids.  Consults  :  none  Procedures  : None  DVT  Prophylaxis  :  [place] lovenox  Lab Results  Component Value Date   PLT 185 01/10/2019   PLT 197 01/10/2019    Antibiotics  :    Anti-infectives (  From admission, onward)   Start     Dose/Rate Route Frequency Ordered Stop   01/11/19 1100  remdesivir 100 mg in sodium chloride 0.9 % 230 mL IVPB     100 mg 500 mL/hr over 30 Minutes Intravenous Every 24 hours 01/10/19 0958     01/10/19 1100  remdesivir 200 mg in sodium chloride 0.9 % 210 mL IVPB     200 mg 500 mL/hr over 30 Minutes Intravenous Once 01/10/19 0958     01/09/19 2245  cefTRIAXone (ROCEPHIN) 2 g in sodium chloride 0.9 % 100 mL IVPB     2 g 200 mL/hr over 30 Minutes Intravenous Every 24 hours 01/09/19 2243     01/09/19 2245  azithromycin (ZITHROMAX) 500 mg in sodium chloride 0.9 % 250 mL IVPB     500 mg 250 mL/hr over 60 Minutes Intravenous Every 24 hours 01/09/19 2243          Objective:   Vitals:   01/10/19 0514 01/10/19 0800 01/10/19 0937 01/10/19 1000  BP: 132/86 (!) 145/87 131/81 131/81  Pulse: 82 86  95  Resp: 18 (!) 25 14 19   Temp: 99 F (37.2 C)  (!) 103.2 F (39.6 C)   TempSrc: Oral  Oral   SpO2: 98% 95% 96% 95%  Weight:      Height:        Wt Readings from Last 3 Encounters:  01/09/19 81.6 kg     Intake/Output Summary (Last 24 hours) at 01/10/2019 1246 Last data filed at 01/10/2019 0112 Gross per 24 hour  Intake 450 ml  Output -  Net 450 ml     Physical Exam  Awake Alert, Oriented X 3, No new F.N deficits, Normal affect Symmetrical Chest wall movement, Good air movement bilaterally, CTAB RRR,No Gallops,Rubs or new Murmurs, No Parasternal Heave +ve B.Sounds, Abd Soft, No tenderness,  No rebound - guarding or rigidity. No Cyanosis, Clubbing or edema, No new Rash or bruise      Data Review:    CBC Recent Labs  Lab 01/09/19 2208 01/10/19 0654 01/10/19 1035  WBC 12.0* 3.6* 10.5  HGB 14.1 15.7 13.5  HCT 43.2 47.7 41.4  PLT 185 5* 197  185  MCV 80.4 79.6* 81.0  MCH 26.3 26.2 26.4   MCHC 32.6 32.9 32.6  RDW 13.2 13.3 13.5  LYMPHSABS 0.9 0.8 0.6*  MONOABS 0.7 0.2 0.7  EOSABS 0.0 0.0 0.0  BASOSABS 0.0 0.0 0.0    Chemistries  Recent Labs  Lab 01/09/19 2208 01/10/19 1035  NA 134* 137  K 2.9* 4.1  CL 100 103  CO2 21* 24  GLUCOSE 215* 118*  BUN 10 11  CREATININE 1.23 1.03  CALCIUM 8.4* 8.3*  AST 66*  --   ALT 45*  --   ALKPHOS 26*  --   BILITOT 0.6  --    ------------------------------------------------------------------------------------------------------------------ Recent Labs    01/09/19 2208  TRIG 116    No results found for: HGBA1C ------------------------------------------------------------------------------------------------------------------ No results for input(s): TSH, T4TOTAL, T3FREE, THYROIDAB in the last 72 hours.  Invalid input(s): FREET3 ------------------------------------------------------------------------------------------------------------------ Recent Labs    01/09/19 2208  FERRITIN 1,197*    Coagulation profile Recent Labs  Lab 01/09/19 2208 01/10/19 1035  INR 1.0 PENDING    Recent Labs    01/09/19 2208 01/10/19 1035  DDIMER 0.43 PENDING    Cardiac Enzymes Recent Labs  Lab 01/09/19 2208  TROPONINI <0.03   ------------------------------------------------------------------------------------------------------------------ No results found for: BNP  Inpatient Medications  Scheduled  Meds: . insulin aspart  0-9 Units Subcutaneous TID WC  . methylPREDNISolone (SOLU-MEDROL) injection  40 mg Intravenous Q8H   Continuous Infusions: . sodium chloride 75 mL/hr at 01/10/19 1007  . azithromycin Stopped (01/10/19 0112)  . cefTRIAXone (ROCEPHIN)  IV Stopped (01/09/19 2351)  . remdesivir 200 mg in NS 250 mL     Followed by  . [START ON 01/11/2019] remdesivir 100 mg in NS 250 mL     PRN Meds:.acetaminophen **OR** acetaminophen, ondansetron **OR** ondansetron (ZOFRAN) IV  Micro Results Recent Results (from the  past 240 hour(s))  Culture, blood (Routine x 2)     Status: None (Preliminary result)   Collection Time: 01/09/19  9:45 PM  Result Value Ref Range Status   Specimen Description BLOOD LEFT ARM  Final   Special Requests   Final    BOTTLES DRAWN AEROBIC AND ANAEROBIC Blood Culture adequate volume   Culture   Final    NO GROWTH < 12 HOURS Performed at [facility], [address] [place], [place] [address]    Report Status PENDING  Incomplete  Culture, blood (Routine x 2)     Status: None (Preliminary result)   Collection Time: 01/09/19 10:09 PM  Result Value Ref Range Status   Specimen Description BLOOD LEFT HAND  Final   Special Requests   Final    BOTTLES DRAWN AEROBIC ONLY Blood Culture adequate volume   Culture   Final    NO GROWTH < 12 HOURS Performed at [facility], [address] [place], [place] [address]    Report Status PENDING  Incomplete  SARS Coronavirus 2 (CEPHEID- Performed in [facility]), Hosp Order     Status: Abnormal   Collection Time: 01/09/19 11:15 PM  Result Value Ref Range Status   SARS Coronavirus 2 POSITIVE (A) NEGATIVE Final    Comment: RESULT CALLED TO, READ BACK BY AND VERIFIED WITH: P. PEREZ,RN 0051 01/10/2019 T. TYSOR (NOTE) If result is NEGATIVE SARS-CoV-2 target nucleic acids are NOT DETECTED. The SARS-CoV-2 RNA is generally detectable in upper and lower  respiratory specimens during the acute phase of infection. The lowest  concentration of SARS-CoV-2 viral copies this assay can detect is 250  copies / mL. A negative result does not preclude SARS-CoV-2 infection  and should not be used as the sole basis for treatment or other  patient management decisions.  A negative result may occur with  improper specimen collection / handling, submission of specimen other  than nasopharyngeal swab, presence of viral mutation(s) within the  areas targeted by this assay, and inadequate number of viral copies  (<250 copies / mL). A  negative result must be combined with clinical  observations, patient history, and epidemiological information. If result is POSITIVE SARS-CoV-2 target nucleic acids are DETECTED.  The SARS-CoV-2 RNA is generally detectable in upper and lower  respiratory specimens during the acute phase of infection.  Positive  results are indicative of active infection with SARS-CoV-2.  Clinical  correlation with patient history and other diagnostic information is  necessary to determine patient infection status.  Positive results do  not rule out bacterial infection or co-infection with other viruses. If result is PRESUMPTIVE POSTIVE SARS-CoV-2 nucleic acids MAY BE PRESENT.   A presumptive positive result was obtained on the submitted specimen  and confirmed on repeat testing.  While 2019 novel coronavirus  (SARS-CoV-2) nucleic acids may be present in the submitted sample  additional confirmatory testing may be necessary  for epidemiological  and / or clinical management purposes  to differentiate between  SARS-CoV-2 and other Sarbecovirus currently known to infect humans.  If clinically indicated additional testing with an alternate test  methodology [phone]) i s advised. The SARS-CoV-2 RNA is generally  detectable in upper and lower respiratory specimens during the acute  phase of infection. The expected result is Negative. Fact Sheet for Patients:  StrictlyIdeas.no Fact Sheet for Healthcare Providers: BankingDealers.co.za This test is not yet approved or cleared by the [place] FDA and has been authorized for detection and/or diagnosis of SARS-CoV-2 by FDA under an Emergency Use Authorization (EUA).  This EUA will remain in effect (meaning this test can be used) for the duration of the COVID-19 declaration under Section 564(b)(1) of the Act, 21 U.S.C. section 360bbb-3(b)(1), unless the authorization is terminated or revoked sooner. Performed  at [facility], [place] [address] [place], [place] [address]     Radiology Reports Dg Chest Portable 1 View  Result Date: 01/09/2019 CLINICAL DATA:  38 year old male with history of chest pain, shortness of breath and cough. EXAM: PORTABLE CHEST 1 VIEW COMPARISON:  No priors. FINDINGS: Lung volumes are low. Patchy ill-defined airspace opacities in interstitial prominence throughout the mid to lower lungs bilaterally, concerning for multilobar pneumonia. No definite pleural effusions. No evidence of pulmonary edema. Heart size is normal. IMPRESSION: 1. Findings concerning for multilobar pneumonia. Electronically Signed   By: Vinnie Langton M.D.   On: 01/09/2019 22:17    Time Spent in minutes  No Charge   Phillips Climes M.D on 01/10/2019 at 12:46 PM  Between 7am to 7pm - Pager - [phone]  After 7pm go to [email or web address] - password [facility]  [facility] -  Office  [phone]

## 2019-01-10 NOTE — H&P (Signed)
History and Physical    Patrick EvenerJon Babineau WUJ:[id number] DOB: 12/07/1980 DOA: 01/09/2019  PCP: Patient, No Pcp Per  Patient coming from: Home.  Chief Complaint: Chest pain fever.  HPI: Patrick Bryan is a 38 y.o. male with no significant past medical history presents to the ER because of chest pain and fever since yesterday morning.  Pain is mostly retrosternal increased on deep breaths.  History is limited because patient only speaks limited English and there is no Nurse, learning disabilitytranslator for his language [place] available at this time.  Denies any nausea vomiting abdominal pain diarrhea.  ED Course: In the ER when patient arrived patient was tachycardic febrile with temperature of 103.1 F but was not hypoxic chest x-ray shows multi lobar pneumonia.  Initially was started on antibiotics for community-acquired pneumonia subsequent which patient's COVID-19 came positive.  Patient also has hyperglycemia with sugars in the 200 was also hypokalemic.  Potassium repletion is ordered.  Patient admitted for SIRS secondary to COVID pneumonia.  Ferritin CRP levels were elevated.  Review of Systems: As per HPI, rest all negative.   History reviewed. No pertinent past medical history.  History reviewed. No pertinent surgical history.   reports that he has never smoked. He has never used smokeless tobacco. No history on file for alcohol and drug.  Not on File  Family History  Family history unknown: Yes    Prior to Admission medications   Not on File    Physical Exam: Vitals:   01/10/19 0100 01/10/19 0115 01/10/19 0118 01/10/19 0200  BP:  113/83  105/72  Pulse:  84  76  Resp: (!) 23   (!) 22  Temp:   99 F (37.2 C)   TempSrc:   Oral   SpO2:  97%  100%  Weight:      Height:          Constitutional: Moderately built and nourished. Vitals:   01/10/19 0100 01/10/19 0115 01/10/19 0118 01/10/19 0200  BP:  113/83  105/72  Pulse:  84  76  Resp: (!) 23   (!) 22  Temp:   99 F (37.2 C)   TempSrc:   Oral    SpO2:  97%  100%  Weight:      Height:       Eyes: Anicteric no pallor. ENMT: No discharge from the ears eyes nose and mouth. Neck: No mass felt.  No neck rigidity. Respiratory: No rhonchi or crepitations. Cardiovascular: S1-S2 heard. Abdomen: Soft nontender bowel sounds present. Musculoskeletal: No edema.  No joint effusion. Skin: No rash. Neurologic: Alert awake oriented to time place and person.  Moves all extremities. Psychiatric: Appears normal per normal affect.   Labs on Admission: I have personally reviewed following labs and imaging studies  CBC: Recent Labs  Lab 01/09/19 2208  WBC 12.0*  NEUTROABS 10.2*  HGB 14.1  HCT 43.2  MCV 80.4  PLT 185   Basic Metabolic Panel: Recent Labs  Lab 01/09/19 2208  NA 134*  K 2.9*  CL 100  CO2 21*  GLUCOSE 215*  BUN 10  CREATININE 1.23  CALCIUM 8.4*   GFR: Estimated Creatinine Clearance: 81.6 mL/min (by C-G formula based on SCr of 1.23 mg/dL). Liver Function Tests: Recent Labs  Lab 01/09/19 2208  AST 66*  ALT 45*  ALKPHOS 26*  BILITOT 0.6  PROT 7.7  ALBUMIN 3.4*   No results for input(s): LIPASE, AMYLASE in the last 168 hours. No results for input(s): AMMONIA in the last 168 hours.  Coagulation Profile: Recent Labs  Lab 01/09/19 2208  INR 1.0   Cardiac Enzymes: Recent Labs  Lab 01/09/19 2208  TROPONINI <0.03   BNP (last 3 results) No results for input(s): PROBNP in the last 8760 hours. HbA1C: No results for input(s): HGBA1C in the last 72 hours. CBG: No results for input(s): GLUCAP in the last 168 hours. Lipid Profile: Recent Labs    01/09/19 2208  TRIG 116   Thyroid Function Tests: No results for input(s): TSH, T4TOTAL, FREET4, T3FREE, THYROIDAB in the last 72 hours. Anemia Panel: Recent Labs    01/09/19 2208  FERRITIN 1,197*   Urine analysis:    Component Value Date/Time   COLORURINE YELLOW 01/09/2019 2200   APPEARANCEUR CLEAR 01/09/2019 2200   LABSPEC 1.019 01/09/2019 2200    PHURINE 7.0 01/09/2019 2200   GLUCOSEU NEGATIVE 01/09/2019 2200   HGBUR NEGATIVE 01/09/2019 2200   BILIRUBINUR NEGATIVE 01/09/2019 2200   KETONESUR NEGATIVE 01/09/2019 2200   PROTEINUR 100 (A) 01/09/2019 2200   NITRITE NEGATIVE 01/09/2019 2200   LEUKOCYTESUR NEGATIVE 01/09/2019 2200   Sepsis Labs: @LABRCNTIP (procalcitonin:4,lacticidven:4) ) Recent Results (from the past 240 hour(s))  SARS Coronavirus 2 (CEPHEID- Performed in [place] hospital lab), Hosp Order     Status: Abnormal   Collection Time: 01/09/19 11:15 PM  Result Value Ref Range Status   SARS Coronavirus 2 POSITIVE (A) NEGATIVE Final    Comment: RESULT CALLED TO, READ BACK BY AND VERIFIED WITH: P. PEREZ,RN 0051 01/10/2019 T. TYSOR (NOTE) If result is NEGATIVE SARS-CoV-2 target nucleic acids are NOT DETECTED. The SARS-CoV-2 RNA is generally detectable in upper and lower  respiratory specimens during the acute phase of infection. The lowest  concentration of SARS-CoV-2 viral copies this assay can detect is 250  copies / mL. A negative result does not preclude SARS-CoV-2 infection  and should not be used as the sole basis for treatment or other  patient management decisions.  A negative result may occur with  improper specimen collection / handling, submission of specimen other  than nasopharyngeal swab, presence of viral mutation(s) within the  areas targeted by this assay, and inadequate number of viral copies  (<250 copies / mL). A negative result must be combined with clinical  observations, patient history, and epidemiological information. If result is POSITIVE SARS-CoV-2 target nucleic acids are DETECTED.  The SARS-CoV-2 RNA is generally detectable in upper and lower  respiratory specimens during the acute phase of infection.  Positive  results are indicative of active infection with SARS-CoV-2.  Clinical  correlation with patient history and other diagnostic information is  necessary to determine patient  infection status.  Positive results do  not rule out bacterial infection or co-infection with other viruses. If result is PRESUMPTIVE POSTIVE SARS-CoV-2 nucleic acids MAY BE PRESENT.   A presumptive positive result was obtained on the submitted specimen  and confirmed on repeat testing.  While 2019 novel coronavirus  (SARS-CoV-2) nucleic acids may be present in the submitted sample  additional confirmatory testing may be necessary for epidemiological  and / or clinical management purposes  to differentiate between  SARS-CoV-2 and other Sarbecovirus currently known to infect humans.  If clinically indicated additional testing with an alternate test  methodology [phone]) i s advised. The SARS-CoV-2 RNA is generally  detectable in upper and lower respiratory specimens during the acute  phase of infection. The expected result is Negative. Fact Sheet for Patients:  StrictlyIdeas.no Fact Sheet for Healthcare Providers: BankingDealers.co.za This test is not yet approved  or cleared by the [place] FDA and has been authorized for detection and/or diagnosis of SARS-CoV-2 by FDA under an Emergency Use Authorization (EUA).  This EUA will remain in effect (meaning this test can be used) for the duration of the COVID-19 declaration under Section 564(b)(1) of the Act, 21 U.S.C. section 360bbb-3(b)(1), unless the authorization is terminated or revoked sooner. Performed at [facility], [address] [place], [place] [address]      Radiological Exams on Admission: Dg Chest Portable 1 View  Result Date: 01/09/2019 CLINICAL DATA:  38 year old male with history of chest pain, shortness of breath and cough. EXAM: PORTABLE CHEST 1 VIEW COMPARISON:  No priors. FINDINGS: Lung volumes are low. Patchy ill-defined airspace opacities in interstitial prominence throughout the mid to lower lungs bilaterally, concerning for multilobar pneumonia. No  definite pleural effusions. No evidence of pulmonary edema. Heart size is normal. IMPRESSION: 1. Findings concerning for multilobar pneumonia. Electronically Signed   By: Trudie Reedaniel  Entrikin M.D.   On: 01/09/2019 22:17    EKG: Independently reviewed.  Sinus tachycardia.  Assessment/Plan Principal Problem:   Pneumonia due to COVID-19 virus Active Problems:   SIRS (systemic inflammatory response syndrome) (HCC)   Hyperglycemia    1. SIRS secondary to COVID pneumonia -we will discontinue antibiotics if procalcitonin does not show a rising trend.  Repeat ferritin and CRP levels and also check d-dimer and troponin.  Patient presently not hypoxic but tachycardic and has SIRS features. 2. Hyperglycemia will check hemoglobin A1c. 3. Hypokalemia I do not know exactly what is causing the hyperkalemia.  Replace recheck potassium check magnesium levels.   DVT prophylaxis: Lovenox. Code Status: Full code. Family Communication: Discussed with patient. Disposition Plan: Home. Consults called: None. Admission status: Inpatient.   Eduard ClosArshad N  MD [facility] Pager [phone].  If 7PM-7AM, please contact night-coverage [email or web address] Password [email or web address]  01/10/2019, 4:33 AM

## 2019-01-10 NOTE — Progress Notes (Signed)
Critical lactic acid 3.3. MD at bedside, made aware.  Shad Ledvina, Bing Neighbors, RN

## 2019-01-11 ENCOUNTER — Telehealth: Payer: Self-pay | Admitting: Critical Care Medicine

## 2019-01-11 LAB — COMPREHENSIVE METABOLIC PANEL
ALT: 135 U/L — ABNORMAL HIGH (ref 0–44)
AST: 195 U/L — ABNORMAL HIGH (ref 15–41)
Albumin: 3.2 g/dL — ABNORMAL LOW (ref 3.5–5.0)
Alkaline Phosphatase: 34 U/L — ABNORMAL LOW (ref 38–126)
Anion gap: 10 (ref 5–15)
BUN: 16 mg/dL (ref 6–20)
CO2: 25 mmol/L (ref 22–32)
Calcium: 8.7 mg/dL — ABNORMAL LOW (ref 8.9–10.3)
Chloride: 103 mmol/L (ref 98–111)
Creatinine, Ser: 0.94 mg/dL (ref 0.61–1.24)
GFR calc Af Amer: >60 mL/min (ref >60)
GFR calc non Af Amer: >60 mL/min (ref >60)
Glucose, Bld: 145 mg/dL — ABNORMAL HIGH (ref 70–99)
Potassium: 4.6 mmol/L (ref 3.5–5.1)
Sodium: 138 mmol/L (ref 135–145)
Total Bilirubin: 0.3 mg/dL (ref 0.3–1.2)
Total Protein: 7.8 g/dL (ref 6.5–8.1)

## 2019-01-11 LAB — HIV ANTIBODY (ROUTINE TESTING W REFLEX): HIV Screen 4th Generation wRfx: NONREACTIVE

## 2019-01-11 LAB — CBC
HCT: 44 % (ref 39.0–52.0)
Hemoglobin: 13.8 g/dL (ref 13.0–17.0)
MCH: 26 pg (ref 26.0–34.0)
MCHC: 31.4 g/dL (ref 30.0–36.0)
MCV: 83 fL (ref 80.0–100.0)
Platelets: 222 10*3/uL (ref 150–400)
RBC: 5.3 MIL/uL (ref 4.22–5.81)
RDW: 13.7 % (ref 11.5–15.5)
WBC: 10.8 10*3/uL — ABNORMAL HIGH (ref 4.0–10.5)
nRBC: 0 % (ref 0.0–0.2)

## 2019-01-11 LAB — GLUCOSE, CAPILLARY
Glucose-Capillary: 132 mg/dL — ABNORMAL HIGH (ref 70–99)
Glucose-Capillary: 148 mg/dL — ABNORMAL HIGH (ref 70–99)
Glucose-Capillary: 166 mg/dL — ABNORMAL HIGH (ref 70–99)

## 2019-01-11 LAB — D-DIMER, QUANTITATIVE: D-Dimer, Quant: 0.29 ug/mL-FEU (ref 0.00–0.50)

## 2019-01-11 LAB — PROCALCITONIN: Procalcitonin: 3.01 ng/mL

## 2019-01-11 LAB — C-REACTIVE PROTEIN: CRP: 17.8 mg/dL — ABNORMAL HIGH (ref <1.0)

## 2019-01-11 LAB — FERRITIN: Ferritin: 1492 ng/mL — ABNORMAL HIGH (ref 24–336)

## 2019-01-11 NOTE — Progress Notes (Signed)
PROGRESS NOTE                                                                                                                                                                                                             Patient Demographics:    Patrick Bryan, is a 38 y.o. male, DOB - 04/18/1981, AVW:[id number]  Admit date - 01/09/2019   Admitting Physician Eduard ClosArshad N Kakrakandy, MD  Outpatient Primary MD for the patient is Patient, No Pcp Per  LOS - 1   Chief Complaint  Patient presents with  . Shortness of Breath  . Chest Pain  . Cough       Brief Narrative    38 y.o. male with no significant past medical history presents to the ER because of chest pain and fever, he tested positive for COVID 19, transferred to [facility] for further.   Subjective:    Patrick EvenerJon Bryan today's visit dyspnea has improved nasal cannula this morning, no nausea, .    Assessment  & Plan :    Principal Problem:   Pneumonia due to COVID-19 virus Active Problems:   SIRS (systemic inflammatory response syndrome) (HCC)   Hyperglycemia  Sepsis/acute hypoxic respiratory failure due to COVID 19 pneumonia -Sepsis present on admission. -Elevated procalcitonin at 3.4, continue with IV Rocephin and azithromycin, continue to trend procalcitonin, can be discontinued when patient is more stable. -He was on 2 L nasal cannula yesterday, today. -Continue with IV Solu-Medrol. -Continue with IV Remdesivir. -Encouraged to use incentive spirometry/flutter valve today. -Continue to trend inflammatory markers, they are trending up, continue to monitor closely.  Hold on giving Actemra given elevated procalcitonin, as well elevation in his LFTs, can be considered if he decompensates, heart procalcitonin starts to improve.  COVID-19 Labs  Recent Labs    01/09/19 2208 01/10/19 1035 01/11/19 0503  DDIMER 0.43 0.42 0.29  FERRITIN 1,197* 1,059* 1,492*  LDH 359* 359*  --   CRP 12.9* 13.3* 17.8*    Lab  Results  Component Value Date   SARSCOV2NAA POSITIVE (A) 01/09/2019   SARSCOV2NAA Detected (A) 01/08/2019    Hypokalemia  - repleted  Transaminitis  - trending up, most likely to coagulate, as well patient receiving Remdesivir, will continue to monitor closely  Code Status : Full  Family Communication  : Lovenox  Disposition Plan  : Home  Barriers For Discharge : on IV remdesivir, IV fluids.  Consults  :  none  Procedures  : None  DVT Prophylaxis  :   lovenox  Lab Results  Component Value Date   PLT 222 01/11/2019    Antibiotics  :    Anti-infectives (From admission, onward)   Start     Dose/Rate Route Frequency Ordered Stop   01/11/19 1100  remdesivir 100 mg in sodium chloride 0.9 % 230 mL IVPB     100 mg 500 mL/hr over 30 Minutes Intravenous Every 24 hours 01/10/19 0958     01/10/19 1100  remdesivir 200 mg in sodium chloride 0.9 % 210 mL IVPB     200 mg 500 mL/hr over 30 Minutes Intravenous Once 01/10/19 0958 01/10/19 1318   01/09/19 2245  cefTRIAXone (ROCEPHIN) 2 g in sodium chloride 0.9 % 100 mL IVPB     2 g 200 mL/hr over 30 Minutes Intravenous Every 24 hours 01/09/19 2243     01/09/19 2245  azithromycin (ZITHROMAX) 500 mg in sodium chloride 0.9 % 250 mL IVPB     500 mg 250 mL/hr over 60 Minutes Intravenous Every 24 hours 01/09/19 2243          Objective:   Vitals:   01/11/19 0400 01/11/19 0500 01/11/19 0748 01/11/19 0800  BP:   (!) 134/92 (!) 128/93  Pulse:   60 61  Resp:   (!) 24 (!) 26  Temp: 97.8 F (36.6 C)  98.1 F (36.7 C)   TempSrc: Other (Comment)  Oral   SpO2:   94% 93%  Weight:  80.8 kg    Height:        Wt Readings from Last 3 Encounters:  01/11/19 80.8 kg     Intake/Output Summary (Last 24 hours) at 01/11/2019 1330 Last data filed at 01/11/2019 0945 Gross per 24 hour  Intake 1770.76 ml  Output 1050 ml  Net 720.76 ml     Physical Exam  Awake Alert, Oriented X 3, No new F.N deficits, Normal affect Symmetrical Chest wall  movement, Good air movement bilaterally, CTAB RRR,No Gallops,Rubs or new Murmurs, No Parasternal Heave +ve B.Sounds, Abd Soft, No tenderness, No rebound - guarding or rigidity. No Cyanosis, Clubbing or edema, No new Rash or bruise        Data Review:    CBC Recent Labs  Lab 01/09/19 2208 01/10/19 0654 01/10/19 1035 01/11/19 0503  WBC 12.0* 3.6* 10.5 10.8*  HGB 14.1 15.7 13.5 13.8  HCT 43.2 47.7 41.4 44.0  PLT 185 5* 197  185 222  MCV 80.4 79.6* 81.0 83.0  MCH 26.3 26.2 26.4 26.0  MCHC 32.6 32.9 32.6 31.4  RDW 13.2 13.3 13.5 13.7  LYMPHSABS 0.9 0.8 0.6*  --   MONOABS 0.7 0.2 0.7  --   EOSABS 0.0 0.0 0.0  --   BASOSABS 0.0 0.0 0.0  --     Chemistries  Recent Labs  Lab 01/09/19 2208 01/10/19 1035 01/11/19 0503  NA 134* 137 138  K 2.9* 4.1 4.6  CL 100 103 103  CO2 21* 24 25  GLUCOSE 215* 118* 145*  BUN 10 11 16   CREATININE 1.23 1.03 0.94  CALCIUM 8.4* 8.3* 8.7*  AST 66*  --  195*  ALT 45*  --  135*  ALKPHOS 26*  --  34*  BILITOT 0.6  --  0.3   ------------------------------------------------------------------------------------------------------------------ Recent Labs    01/09/19 2208  TRIG 116    No results found  for: HGBA1C ------------------------------------------------------------------------------------------------------------------ No results for input(s): TSH, T4TOTAL, T3FREE, THYROIDAB in the last 72 hours.  Invalid input(s): FREET3 ------------------------------------------------------------------------------------------------------------------ Recent Labs    01/10/19 1035 01/11/19 0503  FERRITIN 1,059* 1,492*    Coagulation profile Recent Labs  Lab 01/09/19 2208 01/10/19 1035  INR 1.0 1.0    Recent Labs    01/10/19 1035 01/11/19 0503  DDIMER 0.42 0.29    Cardiac Enzymes Recent Labs  Lab 01/09/19 2208  TROPONINI <0.03    ------------------------------------------------------------------------------------------------------------------ No results found for: BNP  Inpatient Medications  Scheduled Meds: . insulin aspart  0-9 Units Subcutaneous TID WC  . methylPREDNISolone (SOLU-MEDROL) injection  40 mg Intravenous Q8H   Continuous Infusions: . sodium chloride Stopped (01/10/19 2126)  . azithromycin Stopped (01/10/19 2331)  . cefTRIAXone (ROCEPHIN)  IV Stopped (01/11/19 0002)  . remdesivir 100 mg in NS 250 mL 100 mg (01/11/19 1218)   PRN Meds:.acetaminophen **OR** acetaminophen, ondansetron **OR** ondansetron (ZOFRAN) IV  Micro Results Recent Results (from the past 240 hour(s))  Novel Coronavirus, NAA ([facility])     Status: Abnormal   Collection Time: 01/08/19 12:00 AM  Result Value Ref Range Status   SARS-CoV-2, NAA Detected (A) Not Detected Final    Comment: This test was developed and its performance characteristics determined by [facility]. This test has not been FDA cleared or approved. This test has been authorized by FDA under an Emergency Use Authorization (EUA). This test is only authorized for the duration of time the declaration that circumstances exist justifying the authorization of the emergency use of in vitro diagnostic tests for detection of SARS-CoV-2 virus and/or diagnosis of COVID-19 infection under section 564(b)(1) of the Act, 21 U.S.C. 161WRU-0(A)(5360bbb-3(b)(1), unless the authorization is terminated or revoked sooner. When diagnostic testing is negative, the possibility of a false negative result should be considered in the context of a patient's recent exposures and the presence of clinical signs and symptoms consistent with COVID-19. An individual without symptoms of COVID-19 and who is not shedding SARS-CoV-2 virus would expect to have a negative (not detected) result in this assay.   Culture, blood (Routine x 2)     Status: None (Preliminary result)   Collection Time:  01/09/19  9:45 PM  Result Value Ref Range Status   Specimen Description BLOOD LEFT ARM  Final   Special Requests   Final    BOTTLES DRAWN AEROBIC AND ANAEROBIC Blood Culture adequate volume   Culture   Final    NO GROWTH 2 DAYS Performed at [facility], [address] [place], [place] [address]    Report Status PENDING  Incomplete  Culture, blood (Routine x 2)     Status: None (Preliminary result)   Collection Time: 01/09/19 10:09 PM  Result Value Ref Range Status   Specimen Description BLOOD LEFT HAND  Final   Special Requests   Final    BOTTLES DRAWN AEROBIC ONLY Blood Culture adequate volume   Culture   Final    NO GROWTH 2 DAYS Performed at [facility], [address] [place], [place] [address]    Report Status PENDING  Incomplete  SARS Coronavirus 2 (CEPHEID- Performed in [facility]), Hosp Order     Status: Abnormal   Collection Time: 01/09/19 11:15 PM  Result Value Ref Range Status   SARS Coronavirus 2 POSITIVE (A) NEGATIVE Final    Comment: RESULT CALLED TO, READ BACK BY AND VERIFIED WITH: P. PEREZ,RN 0051 01/10/2019 T. TYSOR (NOTE) If  result is NEGATIVE SARS-CoV-2 target nucleic acids are NOT DETECTED. The SARS-CoV-2 RNA is generally detectable in upper and lower  respiratory specimens during the acute phase of infection. The lowest  concentration of SARS-CoV-2 viral copies this assay can detect is 250  copies / mL. A negative result does not preclude SARS-CoV-2 infection  and should not be used as the sole basis for treatment or other  patient management decisions.  A negative result may occur with  improper specimen collection / handling, submission of specimen other  than nasopharyngeal swab, presence of viral mutation(s) within the  areas targeted by this assay, and inadequate number of viral copies  (<250 copies / mL). A negative result must be combined with clinical  observations, patient history, and epidemiological information. If  result is POSITIVE SARS-CoV-2 target nucleic acids are DETECTED.  The SARS-CoV-2 RNA is generally detectable in upper and lower  respiratory specimens during the acute phase of infection.  Positive  results are indicative of active infection with SARS-CoV-2.  Clinical  correlation with patient history and other diagnostic information is  necessary to determine patient infection status.  Positive results do  not rule out bacterial infection or co-infection with other viruses. If result is PRESUMPTIVE POSTIVE SARS-CoV-2 nucleic acids MAY BE PRESENT.   A presumptive positive result was obtained on the submitted specimen  and confirmed on repeat testing.  While 2019 novel coronavirus  (SARS-CoV-2) nucleic acids may be present in the submitted sample  additional confirmatory testing may be necessary for epidemiological  and / or clinical management purposes  to differentiate between  SARS-CoV-2 and other Sarbecovirus currently known to infect humans.  If clinically indicated additional testing with an alternate test  methodology [phone]([id number]) i s advised. The SARS-CoV-2 RNA is generally  detectable in upper and lower respiratory specimens during the acute  phase of infection. The expected result is Negative. Fact Sheet for Patients:  BoilerBrush.com.cyhttps://www.fda.gov/media/136312/download Fact Sheet for Healthcare Providers: https://pope.com/https://www.fda.gov/media/136313/download This test is not yet approved or cleared by the [place] FDA and has been authorized for detection and/or diagnosis of SARS-CoV-2 by FDA under an Emergency Use Authorization (EUA).  This EUA will remain in effect (meaning this test can be used) for the duration of the COVID-19 declaration under Section 564(b)(1) of the Act, 21 U.S.C. section 360bbb-3(b)(1), unless the authorization is terminated or revoked sooner. Performed at [facility], [address] [place], [place] [address]     Radiology Reports Dg Chest Portable 1  View  Result Date: 01/09/2019 CLINICAL DATA:  38 year old male with history of chest pain, shortness of breath and cough. EXAM: PORTABLE CHEST 1 VIEW COMPARISON:  No priors. FINDINGS: Lung volumes are low. Patchy ill-defined airspace opacities in interstitial prominence throughout the mid to lower lungs bilaterally, concerning for multilobar pneumonia. No definite pleural effusions. No evidence of pulmonary edema. Heart size is normal. IMPRESSION: 1. Findings concerning for multilobar pneumonia. Electronically Signed   By: Trudie Reedaniel  Entrikin M.D.   On: 01/09/2019 22:17    Time Spent in minutes  35 minutes   Huey Bienenstockawood  M.D on 01/11/2019 at 1:30 PM  Between 7am to 7pm - Pager - [phone]  After 7pm go to [email or web address] - password [email or web address]  [facility] -  Office  [phone]

## 2019-01-11 NOTE — Progress Notes (Signed)
Attempted to speak with pt using interpreter ipad. Pt states he speaks [place]. No options for this on ipad. Used ipad to call voice interpreter and spoke with supervisor who stated we did not have that language. Pt able to speak english to accomplish everyday tasks. Unable to fully educate pt due to language barrier.

## 2019-01-11 NOTE — Progress Notes (Signed)
Called pt's primary person Pah[place] with update. Questions answered.

## 2019-01-11 NOTE — Telephone Encounter (Signed)
This patient's COVID test came back positive from a community testing event on June 6.  When we called the patient to inform him of the result using a [employer] interpreter he told us he had already been admitted to the hospital on 7 June.  Apparently his shortness of breath fever and cough markedly worsened he went to the emergency room and had bilateral infiltrates and was admitted to the [facility] on [address] [place].  Note he had a subsequent COVID test repeated on 7 June and it was positive as well.  The patient states he may to get out of the hospital soon and if that is the case we may will be able to see this patient again in follow-up for a video visit after discharge

## 2019-01-12 DIAGNOSIS — J9621 Acute and chronic respiratory failure with hypoxia: Secondary | ICD-10-CM

## 2019-01-12 DIAGNOSIS — A419 Sepsis, unspecified organism: Secondary | ICD-10-CM

## 2019-01-12 LAB — COMPREHENSIVE METABOLIC PANEL
ALT: 152 U/L — ABNORMAL HIGH (ref 0–44)
AST: 129 U/L — ABNORMAL HIGH (ref 15–41)
Albumin: 2.9 g/dL — ABNORMAL LOW (ref 3.5–5.0)
Alkaline Phosphatase: 33 U/L — ABNORMAL LOW (ref 38–126)
Anion gap: 8 (ref 5–15)
BUN: 22 mg/dL — ABNORMAL HIGH (ref 6–20)
CO2: 24 mmol/L (ref 22–32)
Calcium: 8.5 mg/dL — ABNORMAL LOW (ref 8.9–10.3)
Chloride: 106 mmol/L (ref 98–111)
Creatinine, Ser: 0.95 mg/dL (ref 0.61–1.24)
GFR calc Af Amer: >60 mL/min (ref >60)
GFR calc non Af Amer: >60 mL/min (ref >60)
Glucose, Bld: 138 mg/dL — ABNORMAL HIGH (ref 70–99)
Potassium: 4.1 mmol/L (ref 3.5–5.1)
Sodium: 138 mmol/L (ref 135–145)
Total Bilirubin: 0.2 mg/dL — ABNORMAL LOW (ref 0.3–1.2)
Total Protein: 6.9 g/dL (ref 6.5–8.1)

## 2019-01-12 LAB — CBC
HCT: 41.8 % (ref 39.0–52.0)
Hemoglobin: 13.3 g/dL (ref 13.0–17.0)
MCH: 26.1 pg (ref 26.0–34.0)
MCHC: 31.8 g/dL (ref 30.0–36.0)
MCV: 82 fL (ref 80.0–100.0)
Platelets: 274 10*3/uL (ref 150–400)
RBC: 5.1 MIL/uL (ref 4.22–5.81)
RDW: 13.9 % (ref 11.5–15.5)
WBC: 16.4 10*3/uL — ABNORMAL HIGH (ref 4.0–10.5)
nRBC: 0 % (ref 0.0–0.2)

## 2019-01-12 LAB — GLUCOSE, CAPILLARY
Glucose-Capillary: 165 mg/dL — ABNORMAL HIGH (ref 70–99)
Glucose-Capillary: 143 mg/dL — ABNORMAL HIGH (ref 70–99)
Glucose-Capillary: 171 mg/dL — ABNORMAL HIGH (ref 70–99)
Glucose-Capillary: 171 mg/dL — ABNORMAL HIGH (ref 70–99)
Glucose-Capillary: 157 mg/dL — ABNORMAL HIGH (ref 70–99)

## 2019-01-12 LAB — PROCALCITONIN: Procalcitonin: 1.46 ng/mL

## 2019-01-12 LAB — FERRITIN: Ferritin: 1746 ng/mL — ABNORMAL HIGH (ref 24–336)

## 2019-01-12 LAB — D-DIMER, QUANTITATIVE: D-Dimer, Quant: 0.38 ug/mL-FEU (ref 0.00–0.50)

## 2019-01-12 LAB — C-REACTIVE PROTEIN: CRP: 8 mg/dL — ABNORMAL HIGH (ref <1.0)

## 2019-01-12 MED ORDER — ENOXAPARIN SODIUM 40 MG/0.4ML ~~LOC~~ SOLN
40.0000 mg | SUBCUTANEOUS | Status: DC
  Administered 2019-01-12 – 2019-01-14 (×3): 40 mg via SUBCUTANEOUS
  Filled 2019-01-12 (×3): qty 0.4

## 2019-01-12 MED ORDER — FAMOTIDINE 20 MG PO TABS
20.0000 mg | ORAL_TABLET | Freq: Every day | ORAL | Status: DC
  Administered 2019-01-12 – 2019-01-15 (×4): 20 mg via ORAL
  Filled 2019-01-12 (×4): qty 1

## 2019-01-12 MED ORDER — ZINC SULFATE 220 (50 ZN) MG PO CAPS
220.0000 mg | ORAL_CAPSULE | Freq: Every day | ORAL | Status: DC
  Administered 2019-01-12 – 2019-01-15 (×4): 220 mg via ORAL
  Filled 2019-01-12 (×4): qty 1

## 2019-01-12 MED ORDER — VITAMIN C 500 MG PO TABS
500.0000 mg | ORAL_TABLET | Freq: Every day | ORAL | Status: DC
  Administered 2019-01-12 – 2019-01-15 (×4): 500 mg via ORAL
  Filled 2019-01-12 (×4): qty 1

## 2019-01-12 NOTE — Care Management (Signed)
Patient has a hospital f/u video visit appointment arranged with: [facility] on 01/18/19 @ 1345 with Dr. Asencion Noble; AVS updated.  Midge Minium RN, BSN, NCM-BC, ACM-RN [phone]

## 2019-01-12 NOTE — Progress Notes (Signed)
PROGRESS NOTE  Patrick EvenerJon Bryan ZOX:[id number] DOB: 01/27/1981 DOA: 01/09/2019  PCP: Patient, No Pcp Per  Brief History/Interval Summary: 38 y.o.malewithno significant past medical history presents to the ER because of chest pain and fever, he tested positive for COVID 19, transferred to [facility] for further.   Reason for Visit: Acute respiratory failure with hypoxia due to COVID-19  Consultants: None  Procedures: None  Antibiotics: Anti-infectives (From admission, onward)   Start     Dose/Rate Route Frequency Ordered Stop   01/11/19 1100  remdesivir 100 mg in sodium chloride 0.9 % 230 mL IVPB     100 mg 500 mL/hr over 30 Minutes Intravenous Every 24 hours 01/10/19 0958     01/10/19 1100  remdesivir 200 mg in sodium chloride 0.9 % 210 mL IVPB     200 mg 500 mL/hr over 30 Minutes Intravenous Once 01/10/19 0958 01/10/19 1318   01/09/19 2245  cefTRIAXone (ROCEPHIN) 2 g in sodium chloride 0.9 % 100 mL IVPB     2 g 200 mL/hr over 30 Minutes Intravenous Every 24 hours 01/09/19 2243     01/09/19 2245  azithromycin (ZITHROMAX) 500 mg in sodium chloride 0.9 % 250 mL IVPB     500 mg 250 mL/hr over 60 Minutes Intravenous Every 24 hours 01/09/19 2243         Subjective/Interval History: Unfortunately cannot find an interpreter for the language he speaks.  Patient does speak some [place].  He states that he still has shortness of breath but slightly better compared to yesterday.  Occasional cough.  No nausea vomiting.   Assessment/Plan:  Acute Hypoxic Resp. Failure due to Acute Covid 19 Viral Illness/sepsis present on admission  COVID-19 Labs  Recent Labs    01/09/19 2208 01/10/19 1035 01/11/19 0503 01/12/19 0420  DDIMER 0.43 0.42 0.29 0.38  FERRITIN 1,197* 1,059* 1,492* 1,746*  LDH 359* 359*  --   --   CRP 12.9* 13.3* 17.8* 8.0*    Lab Results  Component Value Date   SARSCOV2NAA POSITIVE (A) 01/09/2019   SARSCOV2NAA Detected (A) 01/08/2019     Fever: Last fever was on 6/8.  Oxygen requirements: Currently on 2 L of oxygen by nasal cannula Antibiotics: Ceftriaxone and azithromycin Remdesivir: Day 3 Steroids: Solu-Medrol 40 mg every 8 hours Diuretics: None Actemra: Not received yet Convalescent Plasma: Not received yet Vitamin C and Zinc: Will initiate DVT Prophylaxis:  Lovenox 40 mg subcu daily  Patient's respiratory status appears to be stable.  He is on Remdesivir and Solu-Medrol.  Due to elevated procalcitonin he is also on ceftriaxone and azithromycin.  Continue to wean down oxygen as much as tolerated.  Incentive spirometry.  Mobilization.  Inflammatory markers have improved.  CRP has decreased to 8.0.  Ferritin 1007 and 46.  LFTs remain elevated.  Leukocytosis is probably due to steroids.  HIV nonreactive.  Prone positioning as much as possible.  Transaminitis LFTs are stable for the most part.  Could be due to Remdesivir.  Continue to monitor for now.  Hypokalemia Corrected.  DVT Prophylaxis: Lovenox PUD Prophylaxis: Famotidine Code Status: Full code Family Communication: Discussed with the patient Disposition Plan: Continue to mobilize.  Wean down oxygen as tolerated.   Medications:  Scheduled: . enoxaparin (LOVENOX) injection  40 mg Subcutaneous Q24H  . famotidine  20 mg Oral Daily  . insulin aspart  0-9 Units Subcutaneous TID WC  . methylPREDNISolone (SOLU-MEDROL) injection  40 mg Intravenous Q8H  . vitamin C  500 mg Oral Daily  .  zinc sulfate  220 mg Oral Daily   Continuous: . sodium chloride 75 mL/hr at 01/12/19 0733  . azithromycin Stopped (01/11/19 2326)  . cefTRIAXone (ROCEPHIN)  IV Stopped (01/11/19 2154)  . remdesivir 100 mg in NS 250 mL 100 mg (01/12/19 1128)   ZOX:[id number]:acetaminophen **OR** acetaminophen, ondansetron **OR** ondansetron (ZOFRAN) IV   Objective:  Vital Signs  Vitals:   01/12/19 0534 01/12/19 0800 01/12/19 1133 01/12/19 1200  BP: 116/79 132/84  112/71  Pulse:  62 72 63  Resp:  (!) 23 (!) 26 (!) 26  Temp: 98.3  F (36.8 C) 98 F (36.7 C) 98 F (36.7 C)   TempSrc: Oral Oral Oral   SpO2:  95% 95% 96%  Weight:      Height:        Intake/Output Summary (Last 24 hours) at 01/12/2019 1302 Last data filed at 01/12/2019 0938 Gross per 24 hour  Intake 2053.62 ml  Output 1176 ml  Net 877.62 ml   Filed Weights   01/09/19 2259 01/11/19 0500 01/12/19 0500  Weight: 81.6 kg 80.8 kg 81.1 kg    General appearance: Awake alert.  In no distress Resp: Mildly tachypneic at rest.  Diminished air entry at the bases.  Crackles bilaterally.  No wheezing or rhonchi Cardio: S1-S2 is normal regular.  No S3-S4.  No rubs murmurs or bruit GI: Abdomen is soft.  Nontender nondistended.  Bowel sounds are present normal.  No masses organomegaly Extremities: No edema.  Full range of motion of lower extremities. Neurologic: Alert and oriented x3.  No focal neurological deficits.    Lab Results:  Data Reviewed: I have personally reviewed following labs and imaging studies  CBC: Recent Labs  Lab 01/09/19 2208 01/10/19 0654 01/10/19 1035 01/11/19 0503 01/12/19 0420  WBC 12.0* 3.6* 10.5 10.8* 16.4*  NEUTROABS 10.2* 2.6 9.1*  --   --   HGB 14.1 15.7 13.5 13.8 13.3  HCT 43.2 47.7 41.4 44.0 41.8  MCV 80.4 79.6* 81.0 83.0 82.0  PLT 185 5* 197  185 222 274    Basic Metabolic Panel: Recent Labs  Lab 01/09/19 2208 01/10/19 1035 01/11/19 0503 01/12/19 0420  NA 134* 137 138 138  K 2.9* 4.1 4.6 4.1  CL 100 103 103 106  CO2 21* 24 25 24   GLUCOSE 215* 118* 145* 138*  BUN 10 11 16  22*  CREATININE 1.23 1.03 0.94 0.95  CALCIUM 8.4* 8.3* 8.7* 8.5*    GFR: Estimated Creatinine Clearance: 105.4 mL/min (by C-G formula based on SCr of 0.95 mg/dL).  Liver Function Tests: Recent Labs  Lab 01/09/19 2208 01/11/19 0503 01/12/19 0420  AST 66* 195* 129*  ALT 45* 135* 152*  ALKPHOS 26* 34* 33*  BILITOT 0.6 0.3 0.2*  PROT 7.7 7.8 6.9  ALBUMIN 3.4* 3.2* 2.9*     Coagulation Profile: Recent Labs  Lab 01/09/19  2208 01/10/19 1035  INR 1.0 1.0    Cardiac Enzymes: Recent Labs  Lab 01/09/19 2208  TROPONINI <0.03    CBG: Recent Labs  Lab 01/11/19 1204 01/11/19 1627 01/11/19 2056 01/12/19 0827 01/12/19 1131  GLUCAP 148* 166* 165* 143* 171*    Lipid Profile: Recent Labs    01/09/19 2208  TRIG 116    Anemia Panel: Recent Labs    01/11/19 0503 01/12/19 0420  FERRITIN 1,492* 1,746*    Recent Results (from the past 240 hour(s))  Novel Coronavirus, NAA ([facility])     Status: Abnormal   Collection Time: 01/08/19 12:00 AM  Result Value Ref Range Status   SARS-CoV-2, NAA Detected (A) Not Detected Final    Comment: This test was developed and its performance characteristics determined by [employer]. This test has not been FDA cleared or approved. This test has been authorized by FDA under an Emergency Use Authorization (EUA). This test is only authorized for the duration of time the declaration that circumstances exist justifying the authorization of the emergency use of in vitro diagnostic tests for detection of SARS-CoV-2 virus and/or diagnosis of COVID-19 infection under section 564(b)(1) of the Act, 21 U.S.C. 174YCX-4(G)(8), unless the authorization is terminated or revoked sooner. When diagnostic testing is negative, the possibility of a false negative result should be considered in the context of a patient's recent exposures and the presence of clinical signs and symptoms consistent with COVID-19. An individual without symptoms of COVID-19 and who is not shedding SARS-CoV-2 virus would expect to have a negative (not detected) result in this assay.   Culture, blood (Routine x 2)     Status: None (Preliminary result)   Collection Time: 01/09/19  9:45 PM  Result Value Ref Range Status   Specimen Description BLOOD LEFT ARM  Final   Special Requests   Final    BOTTLES DRAWN AEROBIC AND ANAEROBIC Blood Culture adequate volume   Culture   Final    NO GROWTH 3 DAYS  Performed at [facility], [address], [place], [place] [address]    Report Status PENDING  Incomplete  Culture, blood (Routine x 2)     Status: None (Preliminary result)   Collection Time: 01/09/19 10:09 PM  Result Value Ref Range Status   Specimen Description BLOOD LEFT HAND  Final   Special Requests   Final    BOTTLES DRAWN AEROBIC ONLY Blood Culture adequate volume   Culture   Final    NO GROWTH 3 DAYS Performed at [facility], [place] [address] [place], [place] [address]    Report Status PENDING  Incomplete  SARS Coronavirus 2 (CEPHEID- Performed in [place] hospital lab), Hosp Order     Status: Abnormal   Collection Time: 01/09/19 11:15 PM  Result Value Ref Range Status   SARS Coronavirus 2 POSITIVE (A) NEGATIVE Final    Comment: RESULT CALLED TO, READ BACK BY AND VERIFIED WITH: P. PEREZ,RN 0051 01/10/2019 T. TYSOR (NOTE) If result is NEGATIVE SARS-CoV-2 target nucleic acids are NOT DETECTED. The SARS-CoV-2 RNA is generally detectable in upper and lower  respiratory specimens during the acute phase of infection. The lowest  concentration of SARS-CoV-2 viral copies this assay can detect is 250  copies / mL. A negative result does not preclude SARS-CoV-2 infection  and should not be used as the sole basis for treatment or other  patient management decisions.  A negative result may occur with  improper specimen collection / handling, submission of specimen other  than nasopharyngeal swab, presence of viral mutation(s) within the  areas targeted by this assay, and inadequate number of viral copies  (<250 copies / mL). A negative result must be combined with clinical  observations, patient history, and epidemiological information. If result is POSITIVE SARS-CoV-2 target nucleic acids are DETECTED.  The SARS-CoV-2 RNA is generally detectable in upper and lower  respiratory specimens during the acute phase of infection.  Positive  results are indicative of  active infection with SARS-CoV-2.  Clinical  correlation with patient history and other diagnostic information is  necessary to determine patient infection status.  Positive results do  not rule out bacterial infection or co-infection with other viruses. If result is PRESUMPTIVE POSTIVE SARS-CoV-2 nucleic acids MAY BE PRESENT.   A presumptive positive result was obtained on the submitted specimen  and confirmed on repeat testing.  While 2019 novel coronavirus  (SARS-CoV-2) nucleic acids may be present in the submitted sample  additional confirmatory testing may be necessary for epidemiological  and / or clinical management purposes  to differentiate between  SARS-CoV-2 and other Sarbecovirus currently known to infect humans.  If clinically indicated additional testing with an alternate test  methodology [phone] i s advised. The SARS-CoV-2 RNA is generally  detectable in upper and lower respiratory specimens during the acute  phase of infection. The expected result is Negative. Fact Sheet for Patients:  BoilerBrush.com.cyhttps://www.fda.gov/media/136312/download Fact Sheet for Healthcare Providers: https://pope.com/https://www.fda.gov/media/136313/download This test is not yet approved or cleared by the [place] FDA and has been authorized for detection and/or diagnosis of SARS-CoV-2 by FDA under an Emergency Use Authorization (EUA).  This EUA will remain in effect (meaning this test can be used) for the duration of the COVID-19 declaration under Section 564(b)(1) of the Act, 21 U.S.C. section 360bbb-3(b)(1), unless the authorization is terminated or revoked sooner. Performed at [facility], [address] [place], [place] [address]       Radiology Studies: No results found.     LOS: 2 days   Ahmaya Ostermiller Rito EhrlichKrishnan  [facility] Pager on [email or web address]  01/12/2019, 1:02 PM

## 2019-01-13 LAB — CBC
HCT: 42.8 % (ref 39.0–52.0)
Hemoglobin: 13.7 g/dL (ref 13.0–17.0)
MCH: 26.2 pg (ref 26.0–34.0)
MCHC: 32 g/dL (ref 30.0–36.0)
MCV: 82 fL (ref 80.0–100.0)
Platelets: 313 10*3/uL (ref 150–400)
RBC: 5.22 MIL/uL (ref 4.22–5.81)
RDW: 13.9 % (ref 11.5–15.5)
WBC: 15.5 10*3/uL — ABNORMAL HIGH (ref 4.0–10.5)
nRBC: 0.1 % (ref 0.0–0.2)

## 2019-01-13 LAB — COMPREHENSIVE METABOLIC PANEL
ALT: 123 U/L — ABNORMAL HIGH (ref 0–44)
AST: 61 U/L — ABNORMAL HIGH (ref 15–41)
Albumin: 3 g/dL — ABNORMAL LOW (ref 3.5–5.0)
Alkaline Phosphatase: 34 U/L — ABNORMAL LOW (ref 38–126)
Anion gap: 9 (ref 5–15)
BUN: 22 mg/dL — ABNORMAL HIGH (ref 6–20)
CO2: 25 mmol/L (ref 22–32)
Calcium: 8.6 mg/dL — ABNORMAL LOW (ref 8.9–10.3)
Chloride: 105 mmol/L (ref 98–111)
Creatinine, Ser: 0.92 mg/dL (ref 0.61–1.24)
GFR calc Af Amer: >60 mL/min (ref >60)
GFR calc non Af Amer: >60 mL/min (ref >60)
Glucose, Bld: 149 mg/dL — ABNORMAL HIGH (ref 70–99)
Potassium: 4.1 mmol/L (ref 3.5–5.1)
Sodium: 139 mmol/L (ref 135–145)
Total Bilirubin: 0.4 mg/dL (ref 0.3–1.2)
Total Protein: 6.9 g/dL (ref 6.5–8.1)

## 2019-01-13 LAB — GLUCOSE, CAPILLARY
Glucose-Capillary: 141 mg/dL — ABNORMAL HIGH (ref 70–99)
Glucose-Capillary: 150 mg/dL — ABNORMAL HIGH (ref 70–99)
Glucose-Capillary: 163 mg/dL — ABNORMAL HIGH (ref 70–99)
Glucose-Capillary: 173 mg/dL — ABNORMAL HIGH (ref 70–99)

## 2019-01-13 LAB — FERRITIN: Ferritin: 1100 ng/mL — ABNORMAL HIGH (ref 24–336)

## 2019-01-13 LAB — D-DIMER, QUANTITATIVE: D-Dimer, Quant: 0.37 ug/mL-FEU (ref 0.00–0.50)

## 2019-01-13 LAB — C-REACTIVE PROTEIN: CRP: 3.6 mg/dL — ABNORMAL HIGH (ref <1.0)

## 2019-01-13 MED ORDER — FUROSEMIDE 10 MG/ML IJ SOLN
40.0000 mg | Freq: Once | INTRAMUSCULAR | Status: AC
  Administered 2019-01-13: 40 mg via INTRAVENOUS
  Filled 2019-01-13: qty 4

## 2019-01-13 MED ORDER — ORAL CARE MOUTH RINSE
15.0000 mL | Freq: Two times a day (BID) | OROMUCOSAL | Status: DC
  Administered 2019-01-13 – 2019-01-16 (×6): 15 mL via OROMUCOSAL

## 2019-01-13 NOTE — Progress Notes (Signed)
PROGRESS NOTE  Alcide EvenerJon Fazekas ZOX:[id number] DOB: 02/20/1981 DOA: 01/09/2019  PCP: Patient, No Pcp Per  Brief History/Interval Summary: 38 y.o.malewithno significant past medical history presents to the ER because of chest pain and fever, he tested positive for COVID 19, transferred [facility].   Reason for Visit: Acute respiratory failure with hypoxia due to COVID-19  Consultants: None  Procedures: None  Antibiotics: Anti-infectives (From admission, onward)   Start     Dose/Rate Route Frequency Ordered Stop   01/11/19 1100  remdesivir 100 mg in sodium chloride 0.9 % 230 mL IVPB     100 mg 500 mL/hr over 30 Minutes Intravenous Every 24 hours 01/10/19 0958     01/10/19 1100  remdesivir 200 mg in sodium chloride 0.9 % 210 mL IVPB     200 mg 500 mL/hr over 30 Minutes Intravenous Once 01/10/19 0958 01/10/19 1318   01/09/19 2245  cefTRIAXone (ROCEPHIN) 2 g in sodium chloride 0.9 % 100 mL IVPB     2 g 200 mL/hr over 30 Minutes Intravenous Every 24 hours 01/09/19 2243 01/14/19 2244   01/09/19 2245  azithromycin (ZITHROMAX) 500 mg in sodium chloride 0.9 % 250 mL IVPB     500 mg 250 mL/hr over 60 Minutes Intravenous Every 24 hours 01/09/19 2243 01/14/19 2244       Subjective/Interval History: Unfortunately cannot find an interpreter for the language he speaks.  Patient does speak some [place].  Patient states that he is feeling better today.  Still short of breath but improved.  Occasional cough.  No nausea or vomiting.    Assessment/Plan:  Acute Hypoxic Resp. Failure due to Acute Covid 19 Viral Illness/sepsis present on admission  COVID-19 Labs  Recent Labs    01/11/19 0503 01/12/19 0420 01/13/19 0300  DDIMER 0.29 0.38 0.37  FERRITIN 1,492* 1,746* 1,100*  CRP 17.8* 8.0* 3.6*    Lab Results  Component Value Date   SARSCOV2NAA POSITIVE (A) 01/09/2019   SARSCOV2NAA Detected (A) 01/08/2019     Fever: Last fever was on 6/8. Oxygen requirements: He is requiring  between 2 to 4 L of oxygen by nasal cannula. Antibiotics: Ceftriaxone and azithromycin.  He will complete a 5-day course Remdesivir: Day 4 Steroids: Solu-Medrol 40 mg every 8 hours Diuretics: None Actemra: Not received yet Convalescent Plasma: Not received yet Vitamin C and Zinc: Continue DVT Prophylaxis:  Lovenox 40 mg subcu daily  Patient's respiratory status appears to be stable.  Still requiring about 4 L of oxygen by nasal cannula.  Although he does feel better today.  His inflammatory markers have improved.  Continue current treatment plan for now.  If he does not improve or if he gets worse may need to consider convalescent plasma.  Patient noted to be in positive fluid balance.  Could consider furosemide.  Prone positioning, mobilization, incentive spirometry.  HIV was nonreactive.  Mild hyperglycemia is due to steroids.  Transaminitis LFTs minimally elevated but stable.  Possibly due to Remdesivir.    Hypokalemia Corrected.  DVT Prophylaxis: Lovenox PUD Prophylaxis: Famotidine Code Status: Full code Family Communication: Discussed with the patient Disposition Plan: Continue to mobilize.  Wean down oxygen as tolerated.   Medications:  Scheduled: . enoxaparin (LOVENOX) injection  40 mg Subcutaneous Q24H  . famotidine  20 mg Oral Daily  . insulin aspart  0-9 Units Subcutaneous TID WC  . mouth rinse  15 mL Mouth Rinse BID  . methylPREDNISolone (SOLU-MEDROL) injection  40 mg Intravenous Q8H  . vitamin  C  500 mg Oral Daily  . zinc sulfate  220 mg Oral Daily   Continuous: . sodium chloride 75 mL/hr at 01/13/19 1017  . azithromycin Stopped (01/13/19 0036)  . cefTRIAXone (ROCEPHIN)  IV Stopped (01/12/19 2248)  . remdesivir 100 mg in NS 250 mL Stopped (01/13/19 1100)   DGU:[id number] **OR** acetaminophen, ondansetron **OR** ondansetron (ZOFRAN) IV   Objective:  Vital Signs  Vitals:   01/13/19 0458 01/13/19 0500 01/13/19 0600 01/13/19 0800  BP: 137/81 137/81  122/90   Pulse:  70 61   Resp:  (!) 21 (!) 24   Temp: 98.6 F (37 C)   98.3 F (36.8 C)  TempSrc: Oral   Oral  SpO2:  (!) 88% 94% 91%  Weight:  81 kg    Height:        Intake/Output Summary (Last 24 hours) at 01/13/2019 1210 Last data filed at 01/13/2019 1100 Gross per 24 hour  Intake 3225.43 ml  Output 400 ml  Net 2825.43 ml   Filed Weights   01/11/19 0500 01/12/19 0500 01/13/19 0500  Weight: 80.8 kg 81.1 kg 81 kg   General appearance: Awake alert.  In no distress Resp: Improved aeration.  Coarse breath sounds.  Few crackles at the bases.  No wheezing or rhonchi. Cardio: S1-S2 is normal regular.  No S3-S4.  No rubs murmurs or bruit GI: Abdomen is soft.  Nontender nondistended.  Bowel sounds are present normal.  No masses organomegaly Extremities: No edema.  Full range of motion of lower extremities. Neurologic: Alert and oriented x3.  No focal neurological deficits.    Lab Results:  Data Reviewed: I have personally reviewed following labs and imaging studies  CBC: Recent Labs  Lab 01/09/19 2208 01/10/19 0654 01/10/19 1035 01/11/19 0503 01/12/19 0420 01/13/19 0300  WBC 12.0* 3.6* 10.5 10.8* 16.4* 15.5*  NEUTROABS 10.2* 2.6 9.1*  --   --   --   HGB 14.1 15.7 13.5 13.8 13.3 13.7  HCT 43.2 47.7 41.4 44.0 41.8 42.8  MCV 80.4 79.6* 81.0 83.0 82.0 82.0  PLT 185 5* 197  185 222 274 875    Basic Metabolic Panel: Recent Labs  Lab 01/09/19 2208 01/10/19 1035 01/11/19 0503 01/12/19 0420 01/13/19 0300  NA 134* 137 138 138 139  K 2.9* 4.1 4.6 4.1 4.1  CL 100 103 103 106 105  CO2 21* 24 25 24 25   GLUCOSE 215* 118* 145* 138* 149*  BUN 10 11 16  22* 22*  CREATININE 1.23 1.03 0.94 0.95 0.92  CALCIUM 8.4* 8.3* 8.7* 8.5* 8.6*    GFR: Estimated Creatinine Clearance: 108.8 mL/min (by C-G formula based on SCr of 0.92 mg/dL).  Liver Function Tests: Recent Labs  Lab 01/09/19 2208 01/11/19 0503 01/12/19 0420 01/13/19 0300  AST 66* 195* 129* 61*  ALT 45* 135* 152* 123*   ALKPHOS 26* 34* 33* 34*  BILITOT 0.6 0.3 0.2* 0.4  PROT 7.7 7.8 6.9 6.9  ALBUMIN 3.4* 3.2* 2.9* 3.0*     Coagulation Profile: Recent Labs  Lab 01/09/19 2208 01/10/19 1035  INR 1.0 1.0    Cardiac Enzymes: Recent Labs  Lab 01/09/19 2208  TROPONINI <0.03    CBG: Recent Labs  Lab 01/12/19 0827 01/12/19 1131 01/12/19 1631 01/12/19 2128 01/13/19 0751  GLUCAP 143* 171* 171* 157* 141*    Anemia Panel: Recent Labs    01/12/19 0420 01/13/19 0300  FERRITIN 1,746* 1,100*    Recent Results (from the past 240 hour(s))  Novel Coronavirus, NAA (  [facility])     Status: Abnormal   Collection Time: 01/08/19 12:00 AM  Result Value Ref Range Status   SARS-CoV-2, NAA Detected (A) Not Detected Final    Comment: This test was developed and its performance characteristics determined by [facility]. This test has not been FDA cleared or approved. This test has been authorized by FDA under an Emergency Use Authorization (EUA). This test is only authorized for the duration of time the declaration that circumstances exist justifying the authorization of the emergency use of in vitro diagnostic tests for detection of SARS-CoV-2 virus and/or diagnosis of COVID-19 infection under section 564(b)(1) of the Act, 21 U.S.C. 161WRU-0(A)(5360bbb-3(b)(1), unless the authorization is terminated or revoked sooner. When diagnostic testing is negative, the possibility of a false negative result should be considered in the context of a patient's recent exposures and the presence of clinical signs and symptoms consistent with COVID-19. An individual without symptoms of COVID-19 and who is not shedding SARS-CoV-2 virus would expect to have a negative (not detected) result in this assay.   Culture, blood (Routine x 2)     Status: None (Preliminary result)   Collection Time: 01/09/19  9:45 PM   Specimen: BLOOD  Result Value Ref Range Status   Specimen Description BLOOD LEFT ARM  Final   Special Requests    Final    BOTTLES DRAWN AEROBIC AND ANAEROBIC Blood Culture adequate volume   Culture   Final    NO GROWTH 4 DAYS Performed at [facility], [address] [place], [place] [address]    Report Status PENDING  Incomplete  Culture, blood (Routine x 2)     Status: None (Preliminary result)   Collection Time: 01/09/19 10:09 PM   Specimen: BLOOD  Result Value Ref Range Status   Specimen Description BLOOD LEFT HAND  Final   Special Requests   Final    BOTTLES DRAWN AEROBIC ONLY Blood Culture adequate volume   Culture   Final    NO GROWTH 4 DAYS Performed at [facility], [address] [place], [place] [address]    Report Status PENDING  Incomplete  SARS Coronavirus 2 (CEPHEID- Performed in [facility] hospital lab), Hosp Order     Status: Abnormal   Collection Time: 01/09/19 11:15 PM   Specimen: Nasopharyngeal Swab  Result Value Ref Range Status   SARS Coronavirus 2 POSITIVE (A) NEGATIVE Final    Comment: RESULT CALLED TO, READ BACK BY AND VERIFIED WITH: P. PEREZ,RN 0051 01/10/2019 T. TYSOR (NOTE) If result is NEGATIVE SARS-CoV-2 target nucleic acids are NOT DETECTED. The SARS-CoV-2 RNA is generally detectable in upper and lower  respiratory specimens during the acute phase of infection. The lowest  concentration of SARS-CoV-2 viral copies this assay can detect is 250  copies / mL. A negative result does not preclude SARS-CoV-2 infection  and should not be used as the sole basis for treatment or other  patient management decisions.  A negative result may occur with  improper specimen collection / handling, submission of specimen other  than nasopharyngeal swab, presence of viral mutation(s) within the  areas targeted by this assay, and inadequate number of viral copies  (<250 copies / mL). A negative result must be combined with clinical  observations, patient history, and epidemiological information. If result is POSITIVE SARS-CoV-2 target nucleic acids are  DETECTED.  The SARS-CoV-2 RNA is generally detectable in upper and lower  respiratory specimens during the acute phase of infection.  Positive  results are indicative of active infection with SARS-CoV-2.  Clinical  correlation with patient history and other diagnostic information is  necessary to determine patient infection status.  Positive results do  not rule out bacterial infection or co-infection with other viruses. If result is PRESUMPTIVE POSTIVE SARS-CoV-2 nucleic acids MAY BE PRESENT.   A presumptive positive result was obtained on the submitted specimen  and confirmed on repeat testing.  While 2019 novel coronavirus  (SARS-CoV-2) nucleic acids may be present in the submitted sample  additional confirmatory testing may be necessary for epidemiological  and / or clinical management purposes  to differentiate between  SARS-CoV-2 and other Sarbecovirus currently known to infect humans.  If clinically indicated additional testing with an alternate test  methodology [phone] i s advised. The SARS-CoV-2 RNA is generally  detectable in upper and lower respiratory specimens during the acute  phase of infection. The expected result is Negative. Fact Sheet for Patients:  BoilerBrush.com.cyhttps://www.fda.gov/media/136312/download Fact Sheet for Healthcare Providers: https://pope.com/https://www.fda.gov/media/136313/download This test is not yet approved or cleared by the [place] FDA and has been authorized for detection and/or diagnosis of SARS-CoV-2 by FDA under an Emergency Use Authorization (EUA).  This EUA will remain in effect (meaning this test can be used) for the duration of the COVID-19 declaration under Section 564(b)(1) of the Act, 21 U.S.C. section 360bbb-3(b)(1), unless the authorization is terminated or revoked sooner. Performed at [facility], [address] [place], [place] [address]       Radiology Studies: No results found.     LOS: 3 days   Xiao Graul Jacobs EngineeringKrishnan  [facility] Pager on [email or web address]  01/13/2019, 12:10 PM

## 2019-01-14 ENCOUNTER — Inpatient Hospital Stay (HOSPITAL_COMMUNITY): Payer: HRSA Program

## 2019-01-14 LAB — COMPREHENSIVE METABOLIC PANEL
ALT: 110 U/L — ABNORMAL HIGH (ref 0–44)
AST: 50 U/L — ABNORMAL HIGH (ref 15–41)
Albumin: 3 g/dL — ABNORMAL LOW (ref 3.5–5.0)
Alkaline Phosphatase: 34 U/L — ABNORMAL LOW (ref 38–126)
Anion gap: 13 (ref 5–15)
BUN: 29 mg/dL — ABNORMAL HIGH (ref 6–20)
CO2: 25 mmol/L (ref 22–32)
Calcium: 8.4 mg/dL — ABNORMAL LOW (ref 8.9–10.3)
Chloride: 101 mmol/L (ref 98–111)
Creatinine, Ser: 1.06 mg/dL (ref 0.61–1.24)
GFR calc Af Amer: >60 mL/min (ref >60)
GFR calc non Af Amer: >60 mL/min (ref >60)
Glucose, Bld: 153 mg/dL — ABNORMAL HIGH (ref 70–99)
Potassium: 4 mmol/L (ref 3.5–5.1)
Sodium: 139 mmol/L (ref 135–145)
Total Bilirubin: 0.3 mg/dL (ref 0.3–1.2)
Total Protein: 6.8 g/dL (ref 6.5–8.1)

## 2019-01-14 LAB — CBC
HCT: 45.5 % (ref 39.0–52.0)
Hemoglobin: 14.3 g/dL (ref 13.0–17.0)
MCH: 25.8 pg — ABNORMAL LOW (ref 26.0–34.0)
MCHC: 31.4 g/dL (ref 30.0–36.0)
MCV: 82 fL (ref 80.0–100.0)
Platelets: 370 10*3/uL (ref 150–400)
RBC: 5.55 MIL/uL (ref 4.22–5.81)
RDW: 13.7 % (ref 11.5–15.5)
WBC: 14.3 10*3/uL — ABNORMAL HIGH (ref 4.0–10.5)
nRBC: 0.2 % (ref 0.0–0.2)

## 2019-01-14 LAB — GLUCOSE, CAPILLARY
Glucose-Capillary: 133 mg/dL — ABNORMAL HIGH (ref 70–99)
Glucose-Capillary: 151 mg/dL — ABNORMAL HIGH (ref 70–99)
Glucose-Capillary: 181 mg/dL — ABNORMAL HIGH (ref 70–99)
Glucose-Capillary: 212 mg/dL — ABNORMAL HIGH (ref 70–99)

## 2019-01-14 LAB — CULTURE, BLOOD (ROUTINE X 2)
Culture: NO GROWTH
Culture: NO GROWTH
Special Requests: ADEQUATE
Special Requests: ADEQUATE

## 2019-01-14 LAB — D-DIMER, QUANTITATIVE: D-Dimer, Quant: 0.49 ug/mL-FEU (ref 0.00–0.50)

## 2019-01-14 LAB — FERRITIN: Ferritin: 758 ng/mL — ABNORMAL HIGH (ref 24–336)

## 2019-01-14 LAB — C-REACTIVE PROTEIN: CRP: 1.9 mg/dL — ABNORMAL HIGH (ref <1.0)

## 2019-01-14 MED ORDER — SODIUM CHLORIDE 0.9 % IV SOLN: INTRAVENOUS | Status: DC | PRN

## 2019-01-14 MED ORDER — BENZONATATE 100 MG PO CAPS
100.0000 mg | ORAL_CAPSULE | Freq: Three times a day (TID) | ORAL | Status: DC
  Administered 2019-01-14 – 2019-01-15 (×6): 100 mg via ORAL
  Filled 2019-01-14 (×5): qty 1

## 2019-01-14 MED ORDER — GUAIFENESIN 100 MG/5ML PO SOLN: 5.0000 mL | ORAL | Status: DC | PRN

## 2019-01-14 MED ORDER — POTASSIUM CHLORIDE CRYS ER 20 MEQ PO TBCR
20.0000 meq | EXTENDED_RELEASE_TABLET | Freq: Once | ORAL | Status: AC
  Administered 2019-01-14: 20 meq via ORAL
  Filled 2019-01-14: qty 1

## 2019-01-14 MED ORDER — FUROSEMIDE 10 MG/ML IJ SOLN
40.0000 mg | Freq: Once | INTRAMUSCULAR | Status: AC
  Administered 2019-01-14: 40 mg via INTRAVENOUS
  Filled 2019-01-14: qty 4

## 2019-01-14 MED ORDER — METHYLPREDNISOLONE SODIUM SUCC 40 MG IJ SOLR
40.0000 mg | Freq: Two times a day (BID) | INTRAMUSCULAR | Status: DC
  Administered 2019-01-14 – 2019-01-15 (×2): 40 mg via INTRAVENOUS
  Filled 2019-01-14 (×2): qty 1

## 2019-01-14 NOTE — Progress Notes (Addendum)
PROGRESS NOTE  Alcide EvenerJon Beckstead BJY:[id number]:[id number] DOB: 01/06/1981 DOA: 01/09/2019  PCP: Patient, No Pcp Per  Brief History/Interval Summary: 38 y.o.malewithno significant past medical history presents to the ER because of chest pain and fever, he tested positive for COVID 19, transferred to [facility] for further.   Reason for Visit: Acute respiratory failure with hypoxia due to COVID-19  Consultants: None  Procedures: None  Antibiotics: Anti-infectives (From admission, onward)   Start     Dose/Rate Route Frequency Ordered Stop   01/11/19 1100  remdesivir 100 mg in sodium chloride 0.9 % 230 mL IVPB     100 mg 500 mL/hr over 30 Minutes Intravenous Every 24 hours 01/10/19 0958     01/10/19 1100  remdesivir 200 mg in sodium chloride 0.9 % 210 mL IVPB     200 mg 500 mL/hr over 30 Minutes Intravenous Once 01/10/19 0958 01/10/19 1318   01/09/19 2245  cefTRIAXone (ROCEPHIN) 2 g in sodium chloride 0.9 % 100 mL IVPB     2 g 200 mL/hr over 30 Minutes Intravenous Every 24 hours 01/09/19 2243 01/13/19 2231   01/09/19 2245  azithromycin (ZITHROMAX) 500 mg in sodium chloride 0.9 % 250 mL IVPB     500 mg 250 mL/hr over 60 Minutes Intravenous Every 24 hours 01/09/19 2243 01/14/19 0027       Subjective/Interval History: Unable to find an interpreter for the language he speaks.  Patient however does speak some [place].  He states that he is feeling better.  Still has a cough.  No nausea vomiting.  Has not ambulated in the hallway yet.      Assessment/Plan:  Acute Hypoxic Resp. Failure due to Acute Covid 19 Viral Illness/sepsis present on admission  COVID-19 Labs  Recent Labs    01/12/19 0420 01/13/19 0300 01/14/19 0400  DDIMER 0.38 0.37 0.49  FERRITIN 1,746* 1,100* 758*  CRP 8.0* 3.6* 1.9*    Lab Results  Component Value Date   SARSCOV2NAA POSITIVE (A) 01/09/2019   SARSCOV2NAA Detected (A) 01/08/2019     Fever: Last fever was on 6/8.   Oxygen requirements: Oxygen requirements have  improved.  He is down to 2 L of oxygen by nasal cannula.  Saturating in the early 90s.   Antibiotics: Ceftriaxone and azithromycin.  He will complete a 5-day course Remdesivir: Day 5 Steroids: Solu-Medrol 40 mg every 8 hours Diuretics: None Actemra: Not received yet Convalescent Plasma: Not received yet Vitamin C and Zinc: Continue DVT Prophylaxis:  Lovenox 40 mg subcu daily  Patient's respiratory status has slowly improved.  He still requiring 2 L of oxygen by nasal cannula saturating in the early 90s.  He did receive Lasix yesterday with good diuresis.  We will give him another dose today.  Continue prone positioning mobilization and incentive spirometry.  HIV was nonreactive.  Mild hyperglycemia is due to steroids.  Steroids will be tapered today.  Inflammatory markers have improved.  CRP is down to 1.9.  Ferritin 758.  Leukocytosis is most likely due to steroids.  Stable findings on chest x-ray done this morning.  Transaminitis LFTs minimally elevated but stable.  Possibly due to Remdesivir.    Hypokalemia Corrected.  DVT Prophylaxis: Lovenox PUD Prophylaxis: Famotidine Code Status: Full code Family Communication: Discussed with the patient Disposition Plan: Continue to mobilize.  Wean down oxygen as tolerated.   Medications:  Scheduled: . benzonatate  100 mg Oral TID  . enoxaparin (LOVENOX) injection  40 mg Subcutaneous Q24H  . famotidine  20 mg  Oral Daily  . insulin aspart  0-9 Units Subcutaneous TID WC  . mouth rinse  15 mL Mouth Rinse BID  . methylPREDNISolone (SOLU-MEDROL) injection  40 mg Intravenous Q8H  . vitamin C  500 mg Oral Daily  . zinc sulfate  220 mg Oral Daily   Continuous: . sodium chloride    . remdesivir 100 mg in NS 250 mL 100 mg (01/14/19 1007)   QQI:WLNLGX chloride, acetaminophen **OR** acetaminophen, guaiFENesin, ondansetron **OR** ondansetron (ZOFRAN) IV   Objective:  Vital Signs  Vitals:   01/13/19 1700 01/13/19 2052 01/14/19 0448 01/14/19  0815  BP: 133/90 122/77 123/87   Pulse: 75  (!) 43   Resp: (!) 24     Temp: 97.9 F (36.6 C) 98.1 F (36.7 C) (!) 97.4 F (36.3 C) 98.2 F (36.8 C)  TempSrc: Oral Oral Oral Oral  SpO2: 95%  92%   Weight:      Height:        Intake/Output Summary (Last 24 hours) at 01/14/2019 1050 Last data filed at 01/14/2019 0400 Gross per 24 hour  Intake 528.33 ml  Output 1900 ml  Net -1371.67 ml   Filed Weights   01/11/19 0500 01/12/19 0500 01/13/19 0500  Weight: 80.8 kg 81.1 kg 81 kg    General appearance: Awake alert.  In no distress Resp: Normal effort at rest.  Coarse breath sounds bilaterally.  Few crackles at the bases.  No wheezing or rhonchi.  Cardio: S1-S2 is normal regular.  No S3-S4.  No rubs murmurs or bruit GI: Abdomen is soft.  Nontender nondistended.  Bowel sounds are present normal.  No masses organomegaly Extremities: No edema.  Full range of motion of lower extremities.    Lab Results:  Data Reviewed: I have personally reviewed following labs and imaging studies  CBC: Recent Labs  Lab 01/09/19 2208 01/10/19 0654 01/10/19 1035 01/11/19 0503 01/12/19 0420 01/13/19 0300 01/14/19 0400  WBC 12.0* 3.6* 10.5 10.8* 16.4* 15.5* 14.3*  NEUTROABS 10.2* 2.6 9.1*  --   --   --   --   HGB 14.1 15.7 13.5 13.8 13.3 13.7 14.3  HCT 43.2 47.7 41.4 44.0 41.8 42.8 45.5  MCV 80.4 79.6* 81.0 83.0 82.0 82.0 82.0  PLT 185 5* 197  185 222 274 313 211    Basic Metabolic Panel: Recent Labs  Lab 01/10/19 1035 01/11/19 0503 01/12/19 0420 01/13/19 0300 01/14/19 0400  NA 137 138 138 139 139  K 4.1 4.6 4.1 4.1 4.0  CL 103 103 106 105 101  CO2 24 25 24 25 25   GLUCOSE 118* 145* 138* 149* 153*  BUN 11 16 22* 22* 29*  CREATININE 1.03 0.94 0.95 0.92 1.06  CALCIUM 8.3* 8.7* 8.5* 8.6* 8.4*    GFR: Estimated Creatinine Clearance: 94.4 mL/min (by C-G formula based on SCr of 1.06 mg/dL).  Liver Function Tests: Recent Labs  Lab 01/09/19 2208 01/11/19 0503 01/12/19 0420  01/13/19 0300 01/14/19 0400  AST 66* 195* 129* 61* 50*  ALT 45* 135* 152* 123* 110*  ALKPHOS 26* 34* 33* 34* 34*  BILITOT 0.6 0.3 0.2* 0.4 0.3  PROT 7.7 7.8 6.9 6.9 6.8  ALBUMIN 3.4* 3.2* 2.9* 3.0* 3.0*     Coagulation Profile: Recent Labs  Lab 01/09/19 2208 01/10/19 1035  INR 1.0 1.0    Cardiac Enzymes: Recent Labs  Lab 01/09/19 2208  TROPONINI <0.03    CBG: Recent Labs  Lab 01/13/19 0751 01/13/19 1254 01/13/19 1706 01/13/19 2055 01/14/19 9417  GLUCAP 141* 150* 163* 173* 133*    Anemia Panel: Recent Labs    01/13/19 0300 01/14/19 0400  FERRITIN 1,100* 758*    Recent Results (from the past 240 hour(s))  Novel Coronavirus, NAA ([facility])     Status: Abnormal   Collection Time: 01/08/19 12:00 AM  Result Value Ref Range Status   SARS-CoV-2, NAA Detected (A) Not Detected Final    Comment: This test was developed and its performance characteristics determined by [facility]. This test has not been FDA cleared or approved. This test has been authorized by FDA under an Emergency Use Authorization (EUA). This test is only authorized for the duration of time the declaration that circumstances exist justifying the authorization of the emergency use of in vitro diagnostic tests for detection of SARS-CoV-2 virus and/or diagnosis of COVID-19 infection under section 564(b)(1) of the Act, 21 U.S.C. 161WRU-0(A)(5360bbb-3(b)(1), unless the authorization is terminated or revoked sooner. When diagnostic testing is negative, the possibility of a false negative result should be considered in the context of a patient's recent exposures and the presence of clinical signs and symptoms consistent with COVID-19. An individual without symptoms of COVID-19 and who is not shedding SARS-CoV-2 virus would expect to have a negative (not detected) result in this assay.   Culture, blood (Routine x 2)     Status: None   Collection Time: 01/09/19  9:45 PM   Specimen: BLOOD  Result Value  Ref Range Status   Specimen Description BLOOD LEFT ARM  Final   Special Requests   Final    BOTTLES DRAWN AEROBIC AND ANAEROBIC Blood Culture adequate volume   Culture   Final    NO GROWTH 5 DAYS Performed at [facility], [address] [place], [place] [address]    Report Status 01/14/2019 FINAL  Final  Culture, blood (Routine x 2)     Status: None   Collection Time: 01/09/19 10:09 PM   Specimen: BLOOD  Result Value Ref Range Status   Specimen Description BLOOD LEFT HAND  Final   Special Requests   Final    BOTTLES DRAWN AEROBIC ONLY Blood Culture adequate volume   Culture   Final    NO GROWTH 5 DAYS Performed at [facility], [address] [place], [place] [address]    Report Status 01/14/2019 FINAL  Final  SARS Coronavirus 2 (CEPHEID- Performed in [facility] hospital lab), Hosp Order     Status: Abnormal   Collection Time: 01/09/19 11:15 PM   Specimen: Nasopharyngeal Swab  Result Value Ref Range Status   SARS Coronavirus 2 POSITIVE (A) NEGATIVE Final    Comment: RESULT CALLED TO, READ BACK BY AND VERIFIED WITH: P. PEREZ,RN 0051 01/10/2019 T. TYSOR (NOTE) If result is NEGATIVE SARS-CoV-2 target nucleic acids are NOT DETECTED. The SARS-CoV-2 RNA is generally detectable in upper and lower  respiratory specimens during the acute phase of infection. The lowest  concentration of SARS-CoV-2 viral copies this assay can detect is 250  copies / mL. A negative result does not preclude SARS-CoV-2 infection  and should not be used as the sole basis for treatment or other  patient management decisions.  A negative result may occur with  improper specimen collection / handling, submission of specimen other  than nasopharyngeal swab, presence of viral mutation(s) within the  areas targeted by this assay, and inadequate number of viral copies  (<250 copies / mL). A negative result must be combined with clinical  observations, patient history,  and epidemiological  information. If result is POSITIVE SARS-CoV-2 target nucleic acids are DETECTED.  The SARS-CoV-2 RNA is generally detectable in upper and lower  respiratory specimens during the acute phase of infection.  Positive  results are indicative of active infection with SARS-CoV-2.  Clinical  correlation with patient history and other diagnostic information is  necessary to determine patient infection status.  Positive results do  not rule out bacterial infection or co-infection with other viruses. If result is PRESUMPTIVE POSTIVE SARS-CoV-2 nucleic acids MAY BE PRESENT.   A presumptive positive result was obtained on the submitted specimen  and confirmed on repeat testing.  While 2019 novel coronavirus  (SARS-CoV-2) nucleic acids may be present in the submitted sample  additional confirmatory testing may be necessary for epidemiological  and / or clinical management purposes  to differentiate between  SARS-CoV-2 and other Sarbecovirus currently known to infect humans.  If clinically indicated additional testing with an alternate test  methodology [phone]([id number]) i s advised. The SARS-CoV-2 RNA is generally  detectable in upper and lower respiratory specimens during the acute  phase of infection. The expected result is Negative. Fact Sheet for Patients:  BoilerBrush.com.cyhttps://www.fda.gov/media/136312/download Fact Sheet for Healthcare Providers: https://pope.com/https://www.fda.gov/media/136313/download This test is not yet approved or cleared by the [place] FDA and has been authorized for detection and/or diagnosis of SARS-CoV-2 by FDA under an Emergency Use Authorization (EUA).  This EUA will remain in effect (meaning this test can be used) for the duration of the COVID-19 declaration under Section 564(b)(1) of the Act, 21 U.S.C. section 360bbb-3(b)(1), unless the authorization is terminated or revoked sooner. Performed at [facility], [address] [place], [place] [address]       Radiology Studies: Dg  Chest Port 1 View  Result Date: 01/14/2019 CLINICAL DATA:  Pneumonia due to COVID-19. EXAM: PORTABLE CHEST 1 VIEW COMPARISON:  January 09, 2011 FINDINGS: The findings of multi lobar pneumonia persists, similar in the interval, with patchy, primarily peripheral, infiltrates. The cardiomediastinal silhouette is normal. No pneumothorax. No other abnormalities. IMPRESSION: Patchy bilateral infiltrates consistent with multi lobar pneumonia persist and are stable. Electronically Signed   By: Gerome Samavid  Williams III M.D   On: 01/14/2019 08:31       LOS: 4 days   Osvaldo ShipperGokul Stevan Eberwein  [facility] Pager on [email or web address]  01/14/2019, 10:50 AM

## 2019-01-15 DIAGNOSIS — R740 Nonspecific elevation of levels of transaminase and lactic acid dehydrogenase [LDH]: Secondary | ICD-10-CM

## 2019-01-15 LAB — COMPREHENSIVE METABOLIC PANEL
ALT: 114 U/L — ABNORMAL HIGH (ref 0–44)
AST: 45 U/L — ABNORMAL HIGH (ref 15–41)
Albumin: 3.3 g/dL — ABNORMAL LOW (ref 3.5–5.0)
Alkaline Phosphatase: 42 U/L (ref 38–126)
Anion gap: 11 (ref 5–15)
BUN: 35 mg/dL — ABNORMAL HIGH (ref 6–20)
CO2: 27 mmol/L (ref 22–32)
Calcium: 9 mg/dL (ref 8.9–10.3)
Chloride: 102 mmol/L (ref 98–111)
Creatinine, Ser: 1.05 mg/dL (ref 0.61–1.24)
GFR calc Af Amer: >60 mL/min (ref >60)
GFR calc non Af Amer: >60 mL/min (ref >60)
Glucose, Bld: 140 mg/dL — ABNORMAL HIGH (ref 70–99)
Potassium: 4.2 mmol/L (ref 3.5–5.1)
Sodium: 140 mmol/L (ref 135–145)
Total Bilirubin: 0.5 mg/dL (ref 0.3–1.2)
Total Protein: 7.3 g/dL (ref 6.5–8.1)

## 2019-01-15 LAB — CBC
HCT: 50.2 % (ref 39.0–52.0)
Hemoglobin: 15.8 g/dL (ref 13.0–17.0)
MCH: 26 pg (ref 26.0–34.0)
MCHC: 31.5 g/dL (ref 30.0–36.0)
MCV: 82.6 fL (ref 80.0–100.0)
Platelets: 412 10*3/uL — ABNORMAL HIGH (ref 150–400)
RBC: 6.08 MIL/uL — ABNORMAL HIGH (ref 4.22–5.81)
RDW: 14 % (ref 11.5–15.5)
WBC: 16.2 10*3/uL — ABNORMAL HIGH (ref 4.0–10.5)
nRBC: 0.3 % — ABNORMAL HIGH (ref 0.0–0.2)

## 2019-01-15 LAB — C-REACTIVE PROTEIN: CRP: 1.2 mg/dL — ABNORMAL HIGH (ref <1.0)

## 2019-01-15 LAB — GLUCOSE, CAPILLARY
Glucose-Capillary: 116 mg/dL — ABNORMAL HIGH (ref 70–99)
Glucose-Capillary: 173 mg/dL — ABNORMAL HIGH (ref 70–99)
Glucose-Capillary: 154 mg/dL — ABNORMAL HIGH (ref 70–99)
Glucose-Capillary: 197 mg/dL — ABNORMAL HIGH (ref 70–99)

## 2019-01-15 LAB — D-DIMER, QUANTITATIVE: D-Dimer, Quant: 0.46 ug/mL-FEU (ref 0.00–0.50)

## 2019-01-15 LAB — FERRITIN: Ferritin: 844 ng/mL — ABNORMAL HIGH (ref 24–336)

## 2019-01-15 MED ORDER — POTASSIUM CHLORIDE CRYS ER 20 MEQ PO TBCR
20.0000 meq | EXTENDED_RELEASE_TABLET | Freq: Once | ORAL | Status: AC
  Administered 2019-01-15: 20 meq via ORAL
  Filled 2019-01-15: qty 1

## 2019-01-15 MED ORDER — FUROSEMIDE 10 MG/ML IJ SOLN
40.0000 mg | Freq: Once | INTRAMUSCULAR | Status: AC
  Administered 2019-01-15: 40 mg via INTRAVENOUS
  Filled 2019-01-15: qty 4

## 2019-01-15 MED ORDER — PREDNISONE 20 MG PO TABS
40.0000 mg | ORAL_TABLET | Freq: Two times a day (BID) | ORAL | Status: DC
  Administered 2019-01-15 – 2019-01-16 (×2): 40 mg via ORAL
  Filled 2019-01-15 (×2): qty 2

## 2019-01-15 NOTE — Progress Notes (Signed)
PROGRESS NOTE  Alcide EvenerJon Burnham ZOX:[id number] DOB: 02/28/1981 DOA: 01/09/2019  PCP: Patient, No Pcp Per  Brief History/Interval Summary: 38 y.o.malewithno significant past medical history presents to the ER because of chest pain and fever, he tested positive for COVID 19, [facility].   Reason for Visit: Acute respiratory failure with hypoxia due to COVID-19  Consultants: None  Procedures: None  Antibiotics: Anti-infectives (From admission, onward)   Start     Dose/Rate Route Frequency Ordered Stop   01/11/19 1100  remdesivir 100 mg in sodium chloride 0.9 % 230 mL IVPB  Status:  Discontinued     100 mg 500 mL/hr over 30 Minutes Intravenous Every 24 hours 01/10/19 0958 01/15/19 0942   01/10/19 1100  remdesivir 200 mg in sodium chloride 0.9 % 210 mL IVPB     200 mg 500 mL/hr over 30 Minutes Intravenous Once 01/10/19 0958 01/10/19 1318   01/09/19 2245  cefTRIAXone (ROCEPHIN) 2 g in sodium chloride 0.9 % 100 mL IVPB     2 g 200 mL/hr over 30 Minutes Intravenous Every 24 hours 01/09/19 2243 01/13/19 2231   01/09/19 2245  azithromycin (ZITHROMAX) 500 mg in sodium chloride 0.9 % 250 mL IVPB     500 mg 250 mL/hr over 60 Minutes Intravenous Every 24 hours 01/09/19 2243 01/14/19 0027       Subjective/Interval History: Unable to find an interpreter for the language he speaks.  Patient however does speak some [place].  He states that he wants to go home soon.  He denies any shortness of breath but continues to have a cough.  Continues to be on oxygen.     Assessment/Plan:  Acute Hypoxic Resp. Failure due to Acute Covid 19 Viral Illness/sepsis present on admission  COVID-19 Labs  Recent Labs    01/13/19 0300 01/14/19 0400 01/15/19 0500  DDIMER 0.37 0.49 0.46  FERRITIN 1,100* 758* 844*  CRP 3.6* 1.9* 1.2*    Lab Results  Component Value Date   SARSCOV2NAA POSITIVE (A) 01/09/2019   SARSCOV2NAA Detected (A) 01/08/2019     Fever: Last fever was on 6/8.    Oxygen requirements: He remains on 2 L of oxygen by nasal cannula.  Saturating in the 90s.    Antibiotics: He completed a 5-day course of ceftriaxone and azithromycin Remdesivir: Has completed 5-day course of Remdesivir Steroids: Solu-Medrol 40 mg every 8 hours.  Will change to oral steroids Diuretics: Has been given diuretics the last 2 days Actemra: Not received yet Convalescent Plasma: Not received yet Vitamin C and Zinc: Continue DVT Prophylaxis:  Lovenox 40 mg subcu daily  Patient's respiratory status has improved though he still requiring 2 L of oxygen via nasal cannula.  RN to continue to wean him down.  Check saturations with mobilization.  He may need home oxygen.  Prone positioning incentive spirometry.  HIV was nonreactive.  Mild hyperglycemia due to steroids.  Steroids to be tapered and changed to oral today.  CRP has improved.  Leukocytosis is most likely due to steroids.  He has completed 5-day course of antibiotics and has completed a 5-day course of Remdesivir.  Transaminitis LFTs minimally elevated but stable.  Possibly due to Remdesivir.    Hypokalemia Corrected.  DVT Prophylaxis: Lovenox PUD Prophylaxis: Famotidine Code Status: Full code Family Communication: Discussed with the patient Disposition Plan: Continue to mobilize.  Give another dose of Lasix today.  Change to oral steroids.  Possible discharge in 24 to 48 hours.   Medications:  Scheduled: . benzonatate  100 mg Oral TID  . enoxaparin (LOVENOX) injection  40 mg Subcutaneous Q24H  . famotidine  20 mg Oral Daily  . insulin aspart  0-9 Units Subcutaneous TID WC  . mouth rinse  15 mL Mouth Rinse BID  . methylPREDNISolone (SOLU-MEDROL) injection  40 mg Intravenous Q12H  . vitamin C  500 mg Oral Daily  . zinc sulfate  220 mg Oral Daily   Continuous: . sodium chloride     ION:GEXBMW chloride, acetaminophen **OR** acetaminophen, guaiFENesin, ondansetron **OR** ondansetron (ZOFRAN) IV   Objective:   Vital Signs  Vitals:   01/14/19 1620 01/14/19 2051 01/15/19 0529 01/15/19 0729  BP: (!) 122/91 (!) 131/97 121/89 129/90  Pulse: 69 64  (!) 58  Resp: 18 (!) 22  20  Temp: 98 F (36.7 C) 97.9 F (36.6 C) 98 F (36.7 C) 98 F (36.7 C)  TempSrc: Oral Oral Oral Oral  SpO2: 92% 92%  92%  Weight:      Height:        Intake/Output Summary (Last 24 hours) at 01/15/2019 0948 Last data filed at 01/15/2019 0900 Gross per 24 hour  Intake 1360 ml  Output 275 ml  Net 1085 ml   Filed Weights   01/11/19 0500 01/12/19 0500 01/13/19 0500  Weight: 80.8 kg 81.1 kg 81 kg    General appearance: Awake alert.  In no distress Resp: Normal effort at rest.  Coarse breath sounds bilaterally.  Few crackles at the bases.  No wheezing or rhonchi.  Cardio: S1-S2 is normal regular.  No S3-S4.  No rubs murmurs or bruit GI: Abdomen is soft.  Nontender nondistended.  Bowel sounds are present normal.  No masses organomegaly Extremities: No edema.  Full range of motion of lower extremities.    Lab Results:  Data Reviewed: I have personally reviewed following labs and imaging studies  CBC: Recent Labs  Lab 01/09/19 2208 01/10/19 0654 01/10/19 1035 01/11/19 0503 01/12/19 0420 01/13/19 0300 01/14/19 0400 01/15/19 0500  WBC 12.0* 3.6* 10.5 10.8* 16.4* 15.5* 14.3* 16.2*  NEUTROABS 10.2* 2.6 9.1*  --   --   --   --   --   HGB 14.1 15.7 13.5 13.8 13.3 13.7 14.3 15.8  HCT 43.2 47.7 41.4 44.0 41.8 42.8 45.5 50.2  MCV 80.4 79.6* 81.0 83.0 82.0 82.0 82.0 82.6  PLT 185 5* 197  185 222 274 313 370 412*    Basic Metabolic Panel: Recent Labs  Lab 01/11/19 0503 01/12/19 0420 01/13/19 0300 01/14/19 0400 01/15/19 0500  NA 138 138 139 139 140  K 4.6 4.1 4.1 4.0 4.2  CL 103 106 105 101 102  CO2 25 24 25 25 27   GLUCOSE 145* 138* 149* 153* 140*  BUN 16 22* 22* 29* 35*  CREATININE 0.94 0.95 0.92 1.06 1.05  CALCIUM 8.7* 8.5* 8.6* 8.4* 9.0    GFR: Estimated Creatinine Clearance: 95.3 mL/min (by C-G  formula based on SCr of 1.05 mg/dL).  Liver Function Tests: Recent Labs  Lab 01/11/19 0503 01/12/19 0420 01/13/19 0300 01/14/19 0400 01/15/19 0500  AST 195* 129* 61* 50* 45*  ALT 135* 152* 123* 110* 114*  ALKPHOS 34* 33* 34* 34* 42  BILITOT 0.3 0.2* 0.4 0.3 0.5  PROT 7.8 6.9 6.9 6.8 7.3  ALBUMIN 3.2* 2.9* 3.0* 3.0* 3.3*     Coagulation Profile: Recent Labs  Lab 01/09/19 2208 01/10/19 1035  INR 1.0 1.0    Cardiac Enzymes: Recent Labs  Lab 01/09/19  2208  TROPONINI <0.03    CBG: Recent Labs  Lab 01/14/19 0812 01/14/19 1229 01/14/19 1611 01/14/19 2150 01/15/19 0728  GLUCAP 133* 151* 181* 212* 116*    Anemia Panel: Recent Labs    01/14/19 0400 01/15/19 0500  FERRITIN 758* 844*    Recent Results (from the past 240 hour(s))  Novel Coronavirus, NAA ([facility])     Status: Abnormal   Collection Time: 01/08/19 12:00 AM  Result Value Ref Range Status   SARS-CoV-2, NAA Detected (A) Not Detected Final    Comment: This test was developed and its performance characteristics determined by [facility]. This test has not been FDA cleared or approved. This test has been authorized by FDA under an Emergency Use Authorization (EUA). This test is only authorized for the duration of time the declaration that circumstances exist justifying the authorization of the emergency use of in vitro diagnostic tests for detection of SARS-CoV-2 virus and/or diagnosis of COVID-19 infection under section 564(b)(1) of the Act, 21 U.S.C. 841LKG-4(W)(1360bbb-3(b)(1), unless the authorization is terminated or revoked sooner. When diagnostic testing is negative, the possibility of a false negative result should be considered in the context of a patient's recent exposures and the presence of clinical signs and symptoms consistent with COVID-19. An individual without symptoms of COVID-19 and who is not shedding SARS-CoV-2 virus would expect to have a negative (not detected) result in this  assay.   Culture, blood (Routine x 2)     Status: None   Collection Time: 01/09/19  9:45 PM   Specimen: BLOOD  Result Value Ref Range Status   Specimen Description BLOOD LEFT ARM  Final   Special Requests   Final    BOTTLES DRAWN AEROBIC AND ANAEROBIC Blood Culture adequate volume   Culture   Final    NO GROWTH 5 DAYS Performed at [facility], [address] [place], [place] [address]    Report Status 01/14/2019 FINAL  Final  Culture, blood (Routine x 2)     Status: None   Collection Time: 01/09/19 10:09 PM   Specimen: BLOOD  Result Value Ref Range Status   Specimen Description BLOOD LEFT HAND  Final   Special Requests   Final    BOTTLES DRAWN AEROBIC ONLY Blood Culture adequate volume   Culture   Final    NO GROWTH 5 DAYS Performed at [facility], [address] [place], [place] [address]    Report Status 01/14/2019 FINAL  Final  SARS Coronavirus 2 (CEPHEID- Performed in [facility] hospital lab), Hosp Order     Status: Abnormal   Collection Time: 01/09/19 11:15 PM   Specimen: Nasopharyngeal Swab  Result Value Ref Range Status   SARS Coronavirus 2 POSITIVE (A) NEGATIVE Final    Comment: RESULT CALLED TO, READ BACK BY AND VERIFIED WITH: P. PEREZ,RN 0051 01/10/2019 T. TYSOR (NOTE) If result is NEGATIVE SARS-CoV-2 target nucleic acids are NOT DETECTED. The SARS-CoV-2 RNA is generally detectable in upper and lower  respiratory specimens during the acute phase of infection. The lowest  concentration of SARS-CoV-2 viral copies this assay can detect is 250  copies / mL. A negative result does not preclude SARS-CoV-2 infection  and should not be used as the sole basis for treatment or other  patient management decisions.  A negative result may occur with  improper specimen collection / handling, submission of specimen other  than nasopharyngeal swab, presence of viral mutation(s) within the  areas targeted by this assay,  and inadequate number of viral copies   (<250 copies / mL). A negative result must be combined with clinical  observations, patient history, and epidemiological information. If result is POSITIVE SARS-CoV-2 target nucleic acids are DETECTED.  The SARS-CoV-2 RNA is generally detectable in upper and lower  respiratory specimens during the acute phase of infection.  Positive  results are indicative of active infection with SARS-CoV-2.  Clinical  correlation with patient history and other diagnostic information is  necessary to determine patient infection status.  Positive results do  not rule out bacterial infection or co-infection with other viruses. If result is PRESUMPTIVE POSTIVE SARS-CoV-2 nucleic acids MAY BE PRESENT.   A presumptive positive result was obtained on the submitted specimen  and confirmed on repeat testing.  While 2019 novel coronavirus  (SARS-CoV-2) nucleic acids may be present in the submitted sample  additional confirmatory testing may be necessary for epidemiological  and / or clinical management purposes  to differentiate between  SARS-CoV-2 and other Sarbecovirus currently known to infect humans.  If clinically indicated additional testing with an alternate test  methodology [phone] i s advised. The SARS-CoV-2 RNA is generally  detectable in upper and lower respiratory specimens during the acute  phase of infection. The expected result is Negative. Fact Sheet for Patients:  BoilerBrush.com.cyhttps://www.fda.gov/media/136312/download Fact Sheet for Healthcare Providers: https://pope.com/https://www.fda.gov/media/136313/download This test is not yet approved or cleared by the [place] FDA and has been authorized for detection and/or diagnosis of SARS-CoV-2 by FDA under an Emergency Use Authorization (EUA).  This EUA will remain in effect (meaning this test can be used) for the duration of the COVID-19 declaration under Section 564(b)(1) of the Act, 21 U.S.C. section 360bbb-3(b)(1), unless the authorization is terminated or  revoked sooner. Performed at [facility], [address] [place], [place] [address]       Radiology Studies: Dg Chest Port 1 View  Result Date: 01/14/2019 CLINICAL DATA:  Pneumonia due to COVID-19. EXAM: PORTABLE CHEST 1 VIEW COMPARISON:  January 09, 2011 FINDINGS: The findings of multi lobar pneumonia persists, similar in the interval, with patchy, primarily peripheral, infiltrates. The cardiomediastinal silhouette is normal. No pneumothorax. No other abnormalities. IMPRESSION: Patchy bilateral infiltrates consistent with multi lobar pneumonia persist and are stable. Electronically Signed   By: Gerome Samavid  Williams III M.D   On: 01/14/2019 08:31       LOS: 5 days   [employer]  [facility] Pager on [email or web address]  01/15/2019, 9:48 AM

## 2019-01-15 NOTE — Progress Notes (Signed)
Pt ambulating around room without o2 and in no apparent distress. O2 sats dropped high 87s% while on room air and active. [place] reapplied with o2 at 2L. Sats improved to 90s%. Will continue to monitor. Also pt refusing to have this RN call family with update at this time.

## 2019-01-16 LAB — BASIC METABOLIC PANEL
Anion gap: 12 (ref 5–15)
BUN: 39 mg/dL — ABNORMAL HIGH (ref 6–20)
CO2: 31 mmol/L (ref 22–32)
Calcium: 9 mg/dL (ref 8.9–10.3)
Chloride: 99 mmol/L (ref 98–111)
Creatinine, Ser: 1.3 mg/dL — ABNORMAL HIGH (ref 0.61–1.24)
GFR calc Af Amer: >60 mL/min (ref >60)
GFR calc non Af Amer: >60 mL/min (ref >60)
Glucose, Bld: 104 mg/dL — ABNORMAL HIGH (ref 70–99)
Potassium: 4.2 mmol/L (ref 3.5–5.1)
Sodium: 142 mmol/L (ref 135–145)

## 2019-01-16 LAB — GLUCOSE, CAPILLARY: Glucose-Capillary: 104 mg/dL — ABNORMAL HIGH (ref 70–99)

## 2019-01-16 MED ORDER — BENZONATATE 100 MG PO CAPS: 100.0000 mg | ORAL_CAPSULE | Freq: Three times a day (TID) | ORAL | 0 refills | Status: AC | PRN

## 2019-01-16 MED ORDER — PREDNISONE 20 MG PO TABS: ORAL_TABLET | ORAL | 0 refills | Status: AC

## 2019-01-16 MED ORDER — FAMOTIDINE 20 MG PO TABS: 20.0000 mg | ORAL_TABLET | Freq: Every day | ORAL | 0 refills | Status: AC

## 2019-01-16 MED ORDER — ZINC SULFATE 220 (50 ZN) MG PO CAPS: 220.0000 mg | ORAL_CAPSULE | Freq: Every day | ORAL | 0 refills | Status: AC

## 2019-01-16 MED ORDER — ASCORBIC ACID 500 MG PO TABS: 500.0000 mg | ORAL_TABLET | Freq: Every day | ORAL | 0 refills | Status: AC

## 2019-01-16 NOTE — Progress Notes (Signed)
SATURATION QUALIFICATIONS: (This note is used to comply with regulatory documentation for home oxygen)  Patient Saturations on Room Air at Rest = 94%  Patient Saturations on Room Air while Ambulating = 92%  Patient Saturations on 2 Liters of oxygen while Ambulating = 95%  Please briefly explain why patient needs home oxygen:

## 2019-01-16 NOTE — Discharge Instructions (Signed)
Person Under Monitoring Name: Patrick Bryan  Location: [address] [place] [address]   Infection Prevention Recommendations for Individuals Confirmed to have, or Being Evaluated for, 2019 Novel Coronavirus (COVID-19) Infection Who Receive Care at Home  Individuals who are confirmed to have, or are being evaluated for, COVID-19 should follow the prevention steps below until a healthcare provider or local or state health department says they can return to normal activities.  Stay home except to get medical care You should restrict activities outside your home, except for getting medical care. Do not go to work, school, or public areas, and do not use public transportation or taxis.  Call ahead before visiting your doctor Before your medical appointment, call the healthcare provider and tell them that you have, or are being evaluated for, COVID-19 infection. This will help the healthcare providers office take steps to keep other people from getting infected. Ask your healthcare provider to call the local or state health department.  Monitor your symptoms Seek prompt medical attention if your illness is worsening (e.g., difficulty breathing). Before going to your medical appointment, call the healthcare provider and tell them that you have, or are being evaluated for, COVID-19 infection. Ask your healthcare provider to call the local or state health department.  Wear a facemask You should wear a facemask that covers your nose and mouth when you are in the same room with other people and when you visit a healthcare provider. People who live with or visit you should also wear a facemask while they are in the same room with you.  Separate yourself from other people in your home As much as possible, you should stay in a different room from other people in your home. Also, you should use a separate bathroom, if available.  Avoid sharing household items You should not share dishes,  drinking glasses, cups, eating utensils, towels, bedding, or other items with other people in your home. After using these items, you should wash them thoroughly with soap and water.  Cover your coughs and sneezes Cover your mouth and nose with a tissue when you cough or sneeze, or you can cough or sneeze into your sleeve. Throw used tissues in a lined trash can, and immediately wash your hands with soap and water for at least 20 seconds or use an alcohol-based hand rub.  Wash your [facility] your hands often and thoroughly with soap and water for at least 20 seconds. You can use an alcohol-based hand sanitizer if soap and water are not available and if your hands are not visibly dirty. Avoid touching your eyes, nose, and mouth with unwashed hands.   Prevention Steps for Caregivers and Household Members of Individuals Confirmed to have, or Being Evaluated for, COVID-19 Infection Being Cared for in the Home  If you live with, or provide care at home for, a person confirmed to have, or being evaluated for, COVID-19 infection please follow these guidelines to prevent infection:  Follow healthcare providers instructions Make sure that you understand and can help the patient follow any healthcare provider instructions for all care.  Provide for the patients basic needs You should help the patient with basic needs in the home and provide support for getting groceries, prescriptions, and other personal needs.  Monitor the patients symptoms If they are getting sicker, call his or her medical provider and tell them that the patient has, or is being evaluated for, COVID-19 infection. This will help the healthcare providers office take  steps to keep other people from getting infected. Ask the healthcare provider to call the local or state health department.  Limit the number of people who have contact with the patient  If possible, have only one caregiver for the patient.  Other  household members should stay in another home or place of residence. If this is not possible, they should stay  in another room, or be separated from the patient as much as possible. Use a separate bathroom, if available.  Restrict visitors who do not have an essential need to be in the home.  Keep older adults, very young children, and other sick people away from the patient Keep older adults, very young children, and those who have compromised immune systems or chronic health conditions away from the patient. This includes people with chronic heart, lung, or kidney conditions, diabetes, and cancer.  Ensure good ventilation Make sure that shared spaces in the home have good air flow, such as from an air conditioner or an opened window, weather permitting.  Wash your hands often  Wash your hands often and thoroughly with soap and water for at least 20 seconds. You can use an alcohol based hand sanitizer if soap and water are not available and if your hands are not visibly dirty.  Avoid touching your eyes, nose, and mouth with unwashed hands.  Use disposable paper towels to dry your hands. If not available, use dedicated cloth towels and replace them when they become wet.  Wear a facemask and gloves  Wear a disposable facemask at all times in the room and gloves when you touch or have contact with the patients blood, body fluids, and/or secretions or excretions, such as sweat, saliva, sputum, nasal mucus, vomit, urine, or feces.  Ensure the mask fits over your nose and mouth tightly, and do not touch it during use.  Throw out disposable facemasks and gloves after using them. Do not reuse.  Wash your hands immediately after removing your facemask and gloves.  If your personal clothing becomes contaminated, carefully remove clothing and launder. Wash your hands after handling contaminated clothing.  Place all used disposable facemasks, gloves, and other waste in a lined container before  disposing them with other household waste.  Remove gloves and wash your hands immediately after handling these items.  Do not share dishes, glasses, or other household items with the patient  Avoid sharing household items. You should not share dishes, drinking glasses, cups, eating utensils, towels, bedding, or other items with a patient who is confirmed to have, or being evaluated for, COVID-19 infection.  After the person uses these items, you should wash them thoroughly with soap and water.  Wash laundry thoroughly  Immediately remove and wash clothes or bedding that have blood, body fluids, and/or secretions or excretions, such as sweat, saliva, sputum, nasal mucus, vomit, urine, or feces, on them.  Wear gloves when handling laundry from the patient.  Read and follow directions on labels of laundry or clothing items and detergent. In general, wash and dry with the warmest temperatures recommended on the label.  Clean all areas the individual has used often  Clean all touchable surfaces, such as counters, tabletops, doorknobs, bathroom fixtures, toilets, phones, keyboards, tablets, and bedside tables, every day. Also, clean any surfaces that may have blood, body fluids, and/or secretions or excretions on them.  Wear gloves when cleaning surfaces the patient has come in contact with.  Use a diluted bleach solution (e.g., dilute bleach with 1 part bleach  and 10 parts water) or a household disinfectant with a label that says EPA-registered for coronaviruses. To make a bleach solution at home, add 1 tablespoon of bleach to 1 quart (4 cups) of water. For a larger supply, add  cup of bleach to 1 gallon (16 cups) of water.  Read labels of cleaning products and follow recommendations provided on product labels. Labels contain instructions for safe and effective use of the cleaning product including precautions you should take when applying the product, such as wearing gloves or eye protection  and making sure you have good ventilation during use of the product.  Remove gloves and wash hands immediately after cleaning.  Monitor yourself for signs and symptoms of illness Caregivers and household members are considered close contacts, should monitor their health, and will be asked to limit movement outside of the home to the extent possible. Follow the monitoring steps for close contacts listed on the symptom monitoring form.   ? If you have additional questions, contact your local health department or call the epidemiologist on call at [phone] (available 24/7). ? This guidance is subject to change. For the most up-to-date guidance from [facility], please refer to their website: [email or web address]

## 2019-01-16 NOTE — Discharge Summary (Signed)
[facility]  Physician Discharge Summary   Patient ID: Patrick Bryan MRN: [id number] DOB/AGE: 36/08/1980 36 y.o.  Admit date: 01/09/2019 Discharge date: 01/16/2019  PCP: Patient, No Pcp Per  DISCHARGE DIAGNOSES:  Acute respiratory failure with hypoxia due to COVID-19 Pneumonia secondary to COVID-19 Mild transaminitis   RECOMMENDATIONS FOR OUTPATIENT FOLLOW UP: 1. Patient instructed to have his liver function test checked in 2 to 3 weeks   Home Health: None Equipment/Devices: None  CODE STATUS: Full code  DISCHARGE CONDITION: fair  Diet recommendation: As before  INITIAL HISTORY: 38 y.o.malewithno significant past medical history presents to the ER because of chest pain and fever, he tested positive for COVID 19,[facility].    HOSPITAL COURSE:   Acute Hypoxic Resp. Failure due to Acute Covid 19 Viral Illness/sepsis present on admission Patient was admitted to the hospital.  He was placed on oxygen.  He was given Remdesivir and steroids.  He was also given diuretics during the course of this hospitalization.  His oxygen requirements improved gradually.  He is now saturating normal on room air.  He is ambulated in the hallway without any difficulties.  Overall he feels better.  His inflammatory markers have improved.  Okay for discharge today.  HIV was nonreactive.  Leukocytosis due to steroids.  Mildly Elevated creatinine Most likely due to diuretics.  He has been diuresed for the last 3 days.  He is making urine and has good urine output.  No further evaluation at this time.  Renal function should get back to his baseline gradually.  Transaminitis LFTs minimally elevated but stable.  Possibly due to Remdesivir.    Will need to be rechecked in 2 to 3 weeks.  Hypokalemia Corrected.  Overall stable.  Okay for discharge home today.  All questions answered.    PERTINENT LABS:  The results of significant diagnostics from this  hospitalization (including imaging, microbiology, ancillary and laboratory) are listed below for reference.    Microbiology: Recent Results (from the past 240 hour(s))  Novel Coronavirus, NAA ([facility])     Status: Abnormal   Collection Time: 01/08/19 12:00 AM  Result Value Ref Range Status   SARS-CoV-2, NAA Detected (A) Not Detected Final    Comment: This test was developed and its performance characteristics determined by [facility]. This test has not been FDA cleared or approved. This test has been authorized by FDA under an Emergency Use Authorization (EUA). This test is only authorized for the duration of time the declaration that circumstances exist justifying the authorization of the emergency use of in vitro diagnostic tests for detection of SARS-CoV-2 virus and/or diagnosis of COVID-19 infection under section 564(b)(1) of the Act, 21 U.S.C. 409WJX-9(J)(4360bbb-3(b)(1), unless the authorization is terminated or revoked sooner. When diagnostic testing is negative, the possibility of a false negative result should be considered in the context of a patient's recent exposures and the presence of clinical signs and symptoms consistent with COVID-19. An individual without symptoms of COVID-19 and who is not shedding SARS-CoV-2 virus would expect to have a negative (not detected) result in this assay.   Culture, blood (Routine x 2)     Status: None   Collection Time: 01/09/19  9:45 PM   Specimen: BLOOD  Result Value Ref Range Status   Specimen Description BLOOD LEFT ARM  Final   Special Requests   Final    BOTTLES DRAWN AEROBIC AND ANAEROBIC Blood Culture adequate volume   Culture   Final  NO GROWTH 5 DAYS Performed at [facility], [address], [place], [place] [address]    Report Status 01/14/2019 FINAL  Final  Culture, blood (Routine x 2)     Status: None   Collection Time: 01/09/19 10:09 PM   Specimen: BLOOD  Result Value Ref Range Status   Specimen Description  BLOOD LEFT HAND  Final   Special Requests   Final    BOTTLES DRAWN AEROBIC ONLY Blood Culture adequate volume   Culture   Final    NO GROWTH 5 DAYS Performed at [facility], [address] [place], [place] [address]    Report Status 01/14/2019 FINAL  Final  SARS Coronavirus 2 (CEPHEID- Performed in [facility] hospital lab), Hosp Order     Status: Abnormal   Collection Time: 01/09/19 11:15 PM   Specimen: Nasopharyngeal Swab  Result Value Ref Range Status   SARS Coronavirus 2 POSITIVE (A) NEGATIVE Final    Comment: RESULT CALLED TO, READ BACK BY AND VERIFIED WITH: P. PEREZ,RN [id number] 01/10/2019 T. TYSOR (NOTE) If result is NEGATIVE SARS-CoV-2 target nucleic acids are NOT DETECTED. The SARS-CoV-2 RNA is generally detectable in upper and lower  respiratory specimens during the acute phase of infection. The lowest  concentration of SARS-CoV-2 viral copies this assay can detect is 250  copies / mL. A negative result does not preclude SARS-CoV-2 infection  and should not be used as the sole basis for treatment or other  patient management decisions.  A negative result may occur with  improper specimen collection / handling, submission of specimen other  than nasopharyngeal swab, presence of viral mutation(s) within the  areas targeted by this assay, and inadequate number of viral copies  (<250 copies / mL). A negative result must be combined with clinical  observations, patient history, and epidemiological information. If result is POSITIVE SARS-CoV-2 target nucleic acids are DETECTED.  The SARS-CoV-2 RNA is generally detectable in upper and lower  respiratory specimens during the acute phase of infection.  Positive  results are indicative of active infection with SARS-CoV-2.  Clinical  correlation with patient history and other diagnostic information is  necessary to determine patient infection status.  Positive results do  not rule out bacterial infection or co-infection with  other viruses. If result is PRESUMPTIVE POSTIVE SARS-CoV-2 nucleic acids MAY BE PRESENT.   A presumptive positive result was obtained on the submitted specimen  and confirmed on repeat testing.  While 2019 novel coronavirus  (SARS-CoV-2) nucleic acids may be present in the submitted sample  additional confirmatory testing may be necessary for epidemiological  and / or clinical management purposes  to differentiate between  SARS-CoV-2 and other Sarbecovirus currently known to infect humans.  If clinically indicated additional testing with an alternate test  methodology [phone]) i s advised. The SARS-CoV-2 RNA is generally  detectable in upper and lower respiratory specimens during the acute  phase of infection. The expected result is Negative. Fact Sheet for Patients:  BoilerBrush.com.cy Fact Sheet for Healthcare Providers: https://pope.com/ This test is not yet approved or cleared by the [place] FDA and has been authorized for detection and/or diagnosis of SARS-CoV-2 by FDA under an Emergency Use Authorization (EUA).  This EUA will remain in effect (meaning this test can be used) for the duration of the COVID-19 declaration under Section 564(b)(1) of the Act, 21 U.S.C. section 360bbb-3(b)(1), unless the authorization is terminated or revoked sooner. Performed at [facility], [address]  [place], [place] [id number]      Labs: Basic Metabolic Panel: Recent Labs  Lab 01/12/19 0420 01/13/19 0300 01/14/19 0400 01/15/19 0500 01/16/19 0345  NA 138 139 139 140 142  K 4.1 4.1 4.0 4.2 4.2  CL 106 105 101 102 99  CO2 24 25 25 27 31   GLUCOSE 138* 149* 153* 140* 104*  BUN 22* 22* 29* 35* 39*  CREATININE 0.95 0.92 1.06 1.05 1.30*  CALCIUM 8.5* 8.6* 8.4* 9.0 9.0   Liver Function Tests: Recent Labs  Lab 01/11/19 0503 01/12/19 0420 01/13/19 0300 01/14/19 0400 01/15/19 0500  AST 195* 129* 61* 50* 45*  ALT 135* 152*  123* 110* 114*  ALKPHOS 34* 33* 34* 34* 42  BILITOT 0.3 0.2* 0.4 0.3 0.5  PROT 7.8 6.9 6.9 6.8 7.3  ALBUMIN 3.2* 2.9* 3.0* 3.0* 3.3*   CBC: Recent Labs  Lab 01/09/19 2208 01/10/19 0654 01/10/19 1035 01/11/19 0503 01/12/19 0420 01/13/19 0300 01/14/19 0400 01/15/19 0500  WBC 12.0* 3.6* 10.5 10.8* 16.4* 15.5* 14.3* 16.2*  NEUTROABS 10.2* 2.6 9.1*  --   --   --   --   --   HGB 14.1 15.7 13.5 13.8 13.3 13.7 14.3 15.8  HCT 43.2 47.7 41.4 44.0 41.8 42.8 45.5 50.2  MCV 80.4 79.6* 81.0 83.0 82.0 82.0 82.0 82.6  PLT 185 5* 197   185 222 274 313 370 412*   Cardiac Enzymes: Recent Labs  Lab 01/09/19 2208  TROPONINI <0.03    CBG: Recent Labs  Lab 01/15/19 0728 01/15/19 1142 01/15/19 1612 01/15/19 2058 01/16/19 0715  GLUCAP 116* 173* 154* 197* 104*     IMAGING STUDIES Dg Chest Port 1 View  Result Date: 01/14/2019 CLINICAL DATA:  Pneumonia due to COVID-19. EXAM: PORTABLE CHEST 1 VIEW COMPARISON:  January 09, 2011 FINDINGS: The findings of multi lobar pneumonia persists, similar in the interval, with patchy, primarily peripheral, infiltrates. The cardiomediastinal silhouette is normal. No pneumothorax. No other abnormalities. IMPRESSION: Patchy bilateral infiltrates consistent with multi lobar pneumonia persist and are stable. Electronically Signed   By: Gerome Samavid  Williams III M.D   On: 01/14/2019 08:31   Dg Chest Portable 1 View  Result Date: 01/09/2019 CLINICAL DATA:  38 year old male with history of chest pain, shortness of breath and cough. EXAM: PORTABLE CHEST 1 VIEW COMPARISON:  No priors. FINDINGS: Lung volumes are low. Patchy ill-defined airspace opacities in interstitial prominence throughout the mid to lower lungs bilaterally, concerning for multilobar pneumonia. No definite pleural effusions. No evidence of pulmonary edema. Heart size is normal. IMPRESSION: 1. Findings concerning for multilobar pneumonia. Electronically Signed   By: Trudie Reedaniel  Entrikin M.D.   On: 01/09/2019 22:17     DISCHARGE EXAMINATION: Vitals:   01/15/19 1617 01/15/19 1928 01/16/19 0312 01/16/19 0717  BP: (!) 124/99 (!) 118/91 (!) 110/91 107/84  Pulse: 78 74 (!) 55 65  Resp: 20 (!) 22  19  Temp: 98.1 F (36.7 C) 98.2 F (36.8 C) 97.7 F (36.5 C) 98 F (36.7 C)  TempSrc: Oral Oral Oral Oral  SpO2: 90% 92% 93% 90%  Weight:      Height:       General appearance: Awake alert.  In no distress Resp: Effort.  Coarse breath sounds bilaterally.  No wheezing or rhonchi.  No rales. Cardio: S1-S2 is normal regular.  No S3-S4.  No rubs murmurs or bruit GI: Abdomen is soft.  Nontender nondistended.  Bowel sounds are present normal.  No masses organomegaly Extremities: No edema.  Full  range of motion of lower extremities. Neurologic: Alert and oriented x3.  No focal neurological deficits.    DISPOSITION: Home  Discharge Instructions    Call MD for:  difficulty breathing, headache or visual disturbances   Complete by: As directed    Call MD for:  extreme fatigue   Complete by: As directed    Call MD for:  persistant dizziness or light-headedness   Complete by: As directed    Call MD for:  persistant nausea and vomiting   Complete by: As directed    Call MD for:  severe uncontrolled pain   Complete by: As directed    Call MD for:  temperature >100.4   Complete by: As directed    Diet general   Complete by: As directed    Discharge instructions   Complete by: As directed    Have your doctor check your liver function test in 2 to 3 weeks.  COVID 19 INSTRUCTIONS  - You are felt to be stable enough to no longer require inpatient monitoring, testing, and treatment, though you will need to follow the recommendations below: - Based on the CDC's non-test criteria for ending self-isolation: You may not return to work/leave the home until at least 14 days since symptom onset AND 3 days without a fever (without taking tylenol, ibuprofen, etc.) AND have improvement in respiratory symptoms. - Do not  take NSAID medications (including, but not limited to, ibuprofen, advil, motrin, naproxen, aleve, goody's powder, etc.) - Follow up with your doctor in the next week via telehealth or seek medical attention right away if your symptoms get WORSE.  - Consider donating plasma after you have recovered (either 14 days after a negative test or 28 days after symptoms have completely resolved) because your antibodies to this virus may be helpful to give to others with life-threatening infections. Please go to the website [email or web address] if you would like to consider volunteering for plasma donation.    Directions for you at home:  Wear a facemask You should wear a facemask that covers your nose and mouth when you are in the same room with other people and when you visit a healthcare provider. People who live with or visit you should also wear a facemask while they are in the same room with you.  Separate yourself from other people in your home As much as possible, you should stay in a different room from other people in your home. Also, you should use a separate bathroom, if available.  Avoid sharing household items You should not share dishes, drinking glasses, cups, eating utensils, towels, bedding, or other items with other people in your home. After using these items, you should wash them thoroughly with soap and water.  Cover your coughs and sneezes Cover your mouth and nose with a tissue when you cough or sneeze, or you can cough or sneeze into your sleeve. Throw used tissues in a lined trash can, and immediately wash your hands with soap and water for at least 20 seconds or use an alcohol-based hand rub.  Wash your [employer] your hands often and thoroughly with soap and water for at least 20 seconds. You can use an alcohol-based hand sanitizer if soap and water are not available and if your hands are not visibly dirty. Avoid touching your eyes, nose, and mouth with unwashed  hands.  Directions for those who live with, or provide care at home for you:  Limit the number of people who have contact  with the patient If possible, have only one caregiver for the patient. Other household members should stay in another home or place of residence. If this is not possible, they should stay in another room, or be separated from the patient as much as possible. Use a separate bathroom, if available. Restrict visitors who do not have an essential need to be in the home.  Ensure good ventilation Make sure that shared spaces in the home have good air flow, such as from an air conditioner or an opened window, weather permitting.  Wash your hands often Wash your hands often and thoroughly with soap and water for at least 20 seconds. You can use an alcohol based hand sanitizer if soap and water are not available and if your hands are not visibly dirty. Avoid touching your eyes, nose, and mouth with unwashed hands. Use disposable paper towels to dry your hands. If not available, use dedicated cloth towels and replace them when they become wet.  Wear a facemask and gloves Wear a disposable facemask at all times in the room and gloves when you touch or have contact with the patients blood, body fluids, and/or secretions or excretions, such as sweat, saliva, sputum, nasal mucus, vomit, urine, or feces.  Ensure the mask fits over your nose and mouth tightly, and do not touch it during use. Throw out disposable facemasks and gloves after using them. Do not reuse. Wash your hands immediately after removing your facemask and gloves. If your personal clothing becomes contaminated, carefully remove clothing and launder. Wash your hands after handling contaminated clothing. Place all used disposable facemasks, gloves, and other waste in a lined container before disposing them with other household waste. Remove gloves and wash your hands immediately after handling these items.  Do not  share dishes, glasses, or other household items with the patient Avoid sharing household items. You should not share dishes, drinking glasses, cups, eating utensils, towels, bedding, or other items with a patient who is confirmed to have, or being evaluated for, COVID-19 infection. After the person uses these items, you should wash them thoroughly with soap and water.  Wash laundry thoroughly Immediately remove and wash clothes or bedding that have blood, body fluids, and/or secretions or excretions, such as sweat, saliva, sputum, nasal mucus, vomit, urine, or feces, on them. Wear gloves when handling laundry from the patient. Read and follow directions on labels of laundry or clothing items and detergent. In general, wash and dry with the warmest temperatures recommended on the label.  Clean all areas the individual has used often Clean all touchable surfaces, such as counters, tabletops, doorknobs, bathroom fixtures, toilets, phones, keyboards, tablets, and bedside tables, every day. Also, clean any surfaces that may have blood, body fluids, and/or secretions or excretions on them. Wear gloves when cleaning surfaces the patient has come in contact with. Use a diluted bleach solution (e.g., dilute bleach with 1 part bleach and 10 parts water) or a household disinfectant with a label that says EPA-registered for coronaviruses. To make a bleach solution at home, add 1 tablespoon of bleach to 1 quart (4 cups) of water. For a larger supply, add  cup of bleach to 1 gallon (16 cups) of water. Read labels of cleaning products and follow recommendations provided on product labels. Labels contain instructions for safe and effective use of the cleaning product including precautions you should take when applying the product, such as wearing gloves or eye protection and making sure you have good ventilation during use  of the product. Remove gloves and wash hands immediately after cleaning.  Monitor yourself  for signs and symptoms of illness Caregivers and household members are considered close contacts, should monitor their health, and will be asked to limit movement outside of the home to the extent possible. Follow the monitoring steps for close contacts listed on the symptom monitoring form.   If you have additional questions, contact your local health department or call the epidemiologist on call at [phone] (available 24/7). This guidance is subject to change. For the most up-to-date guidance from [facility], please refer to their website: TripMetro.huhttps://www.cdc.gov/coronavirus/2019-ncov/hcp/guidance-prevent-spread.html   You were cared for by a hospitalist during your hospital stay. If you have any questions about your discharge medications or the care you received while you were in the hospital after you are discharged, you can call the unit and asked to speak with the hospitalist on call if the hospitalist that took care of you is not available. Once you are discharged, your primary care physician will handle any further medical issues. Please note that NO REFILLS for any discharge medications will be authorized once you are discharged, as it is imperative that you return to your primary care physician (or establish a relationship with a primary care physician if you do not have one) for your aftercare needs so that they can reassess your need for medications and monitor your lab values. If you do not have a primary care physician, you can call [phone] for a physician referral.   Increase activity slowly   Complete by: As directed         Allergies as of 01/16/2019   Not on File     Medication List    TAKE these medications   ascorbic acid 500 MG tablet Commonly known as: VITAMIN C Take 1 tablet (500 mg total) by mouth daily for 10 days.   benzonatate 100 MG capsule Commonly known as: TESSALON Take 1 capsule (100 mg total) by mouth 3 (three) times daily as needed for cough.   famotidine  20 MG tablet Commonly known as: PEPCID Take 1 tablet (20 mg total) by mouth daily for 10 days.   predniSONE 20 MG tablet Commonly known as: DELTASONE Take 2 tablets once daily for 3 days, then take 1 tablet once daily for 3 days, then STOP   zinc sulfate 220 (50 Zn) MG capsule Take 1 capsule (220 mg total) by mouth daily for 10 days.        Follow-up Information    [facility] Follow up on 01/18/2019.   Why: at 1:45pm; this is a video follow-up visit with Dr. Shan LevansPatrick Wright; Please be available for the call.  Contact information: [address] [place] [phone]          TOTAL DISCHARGE TIME: 35 minutes  Tyus Kallam Rito EhrlichKrishnan  [facility] Pager on [email or web address]  01/16/2019, 10:12 AM

## 2019-01-18 ENCOUNTER — Other Ambulatory Visit: Payer: Self-pay

## 2019-01-18 ENCOUNTER — Encounter: Payer: Self-pay | Admitting: Critical Care Medicine

## 2019-01-18 ENCOUNTER — Ambulatory Visit: Payer: HRSA Program | Attending: Critical Care Medicine | Admitting: Critical Care Medicine

## 2019-01-18 DIAGNOSIS — J1282 Pneumonia due to coronavirus disease 2019: Secondary | ICD-10-CM

## 2019-01-18 DIAGNOSIS — U071 COVID-19: Secondary | ICD-10-CM | POA: Diagnosis not present

## 2019-01-18 DIAGNOSIS — R6510 Systemic inflammatory response syndrome (SIRS) of non-infectious origin without acute organ dysfunction: Secondary | ICD-10-CM

## 2019-01-18 DIAGNOSIS — R739 Hyperglycemia, unspecified: Secondary | ICD-10-CM | POA: Diagnosis not present

## 2019-01-18 DIAGNOSIS — J1289 Other viral pneumonia: Secondary | ICD-10-CM | POA: Diagnosis not present

## 2019-01-18 NOTE — Progress Notes (Signed)
Patient ID: Patrick Bryan, male   DOB: Apr 02, 1981, 38 y.o.   MRN: [id number] Virtual Visit via Telephone Note  I connected with Patrick Bryan on 01/18/19 at  2:00 PM EDT by telephone and verified that I am speaking with the correct person using two identifiers.   Consent:  I discussed the limitations, risks, security and privacy concerns of performing an evaluation and management service by telephone and the availability of in person appointments. I also discussed with the patient that there may be a patient responsible charge related to this service. The patient expressed understanding and agreed to proceed.  Location of patient: I was in my office  Location of provider: The patient was at home  Persons participating in the televisit with the patient.   I used the official montagnard interpreter [email or web address]     History of Present Illness: This is a post hospital visit for this 38 year old [place] male who had tested positive for COVID.  He had come to our community event on June 6 and was tested and was positive when I called the patient it was determined he had already gone to the hospital on June 7 and was admitted for acute pneumonitis and COVID syndrome.   DC summary as below: Admit date: 01/09/2019 Discharge date: 01/16/2019  PCP: Patient, No Pcp Per  DISCHARGE DIAGNOSES:  Acute respiratory failure with hypoxia due to COVID-19 Pneumonia secondary to COVID-19 Mild transaminitis   RECOMMENDATIONS FOR OUTPATIENT FOLLOW UP: 1. Patient instructed to have his liver function test checked in 2 to 3 week  Diet recommendation: As before HPI: Patrick Bryan is a 38 y.o. male with no significant past medical history presents to the ER because of chest pain and fever since yesterday morning.  Pain is mostly retrosternal increased on deep breaths.  History is limited because patient only speaks limited English and there is no Optometrist for his language [place] available at this time.  Denies any  nausea vomiting abdominal pain diarrhea.  ED Course: In the ER when patient arrived patient was tachycardic febrile with temperature of 103.1 F but was not hypoxic chest x-ray shows multi lobar pneumonia.  Initially was started on antibiotics for community-acquired pneumonia subsequent which patient's COVID-19 came positive.  Patient also has hyperglycemia with sugars in the 200 was also hypokalemic.  Potassium repletion is ordered.  Patient admitted for SIRS secondary to COVID pneumonia.  Ferritin CRP levels were elevated.  Review of Systems: As per HPI, rest all negative. INITIAL HISTORY: 38 y.o.malewithno significant past medical history presents to the ER because of chest pain and fever, he tested positive for COVID 19,transferred to [facility] for further management.    HOSPITAL COURSE:   Acute Hypoxic Resp. Failure due to Acute Covid 19 Viral Illness/sepsis present on admission Patient was admitted to the hospital.  He was placed on oxygen.  He was given Remdesivir and steroids.  He was also given diuretics during the course of this hospitalization.  His oxygen requirements improved gradually.  He is now saturating normal on room air.  He is ambulated in the hallway without any difficulties.  Overall he feels better.  His inflammatory markers have improved.  Okay for discharge today.  HIV was nonreactive.  Leukocytosis due to steroids.  Mildly Elevated creatinine Most likely due to diuretics.  He has been diuresed for the last 3 days.  He is making urine and has good urine output.  No further evaluation at this time.  Renal function should get back to  his baseline gradually.  Transaminitis LFTs minimally elevated but stable. Possibly due to Remdesivir.   Will need to be rechecked in 2 to 3 weeks.  Hypokalemia Corrected.  Since discharge the patient is markedly better.  He no longer has any cough shortness of breath chest pain lightheadedness or diarrhea.  All of his symptoms  have completely and totally resolved.  [facility]  He is due to check out of the hotel on June 21.     Observations/Objective: This was a telephone visit so no observations were made  Assessment and Plan: #1 COVID-19 acute viral infection with acute pneumonitis and acute hypoxic respiratory failure now resolved  The patient may be released back to work as of June 22 and I will send the patient a letter to his home to this extent.  The patient will be discharged from the isolation hotel on June 21.  No additional follow-up is necessary.  Note I did give the patient reference to primary care that he can contact for future appointments.   Follow Up Instructions: No further follow-up is indicated   I discussed the assessment and treatment plan with the patient. The patient was provided an opportunity to ask questions and all were answered. The patient agreed with the plan and demonstrated an understanding of the instructions.   The patient was advised to call back or seek an in-person evaluation if the symptoms worsen or if the condition fails to improve as anticipated.  I provided 30 minutes of non-face-to-face time during this encounter  including  median intraservice time , review of notes, labs, imaging, medications  and explaining diagnosis and management to the patient .    Shan LevansPatrick , MD

## 2021-01-19 IMAGING — DX PORTABLE CHEST - 1 VIEW
1 series · 1 of 1 positions shown · non-contrast
Comparison: No priors.

CLINICAL DATA: 38-year-old male with history of chest pain,
shortness of breath and cough.

EXAM:
PORTABLE CHEST 1 VIEW

[chest ap]
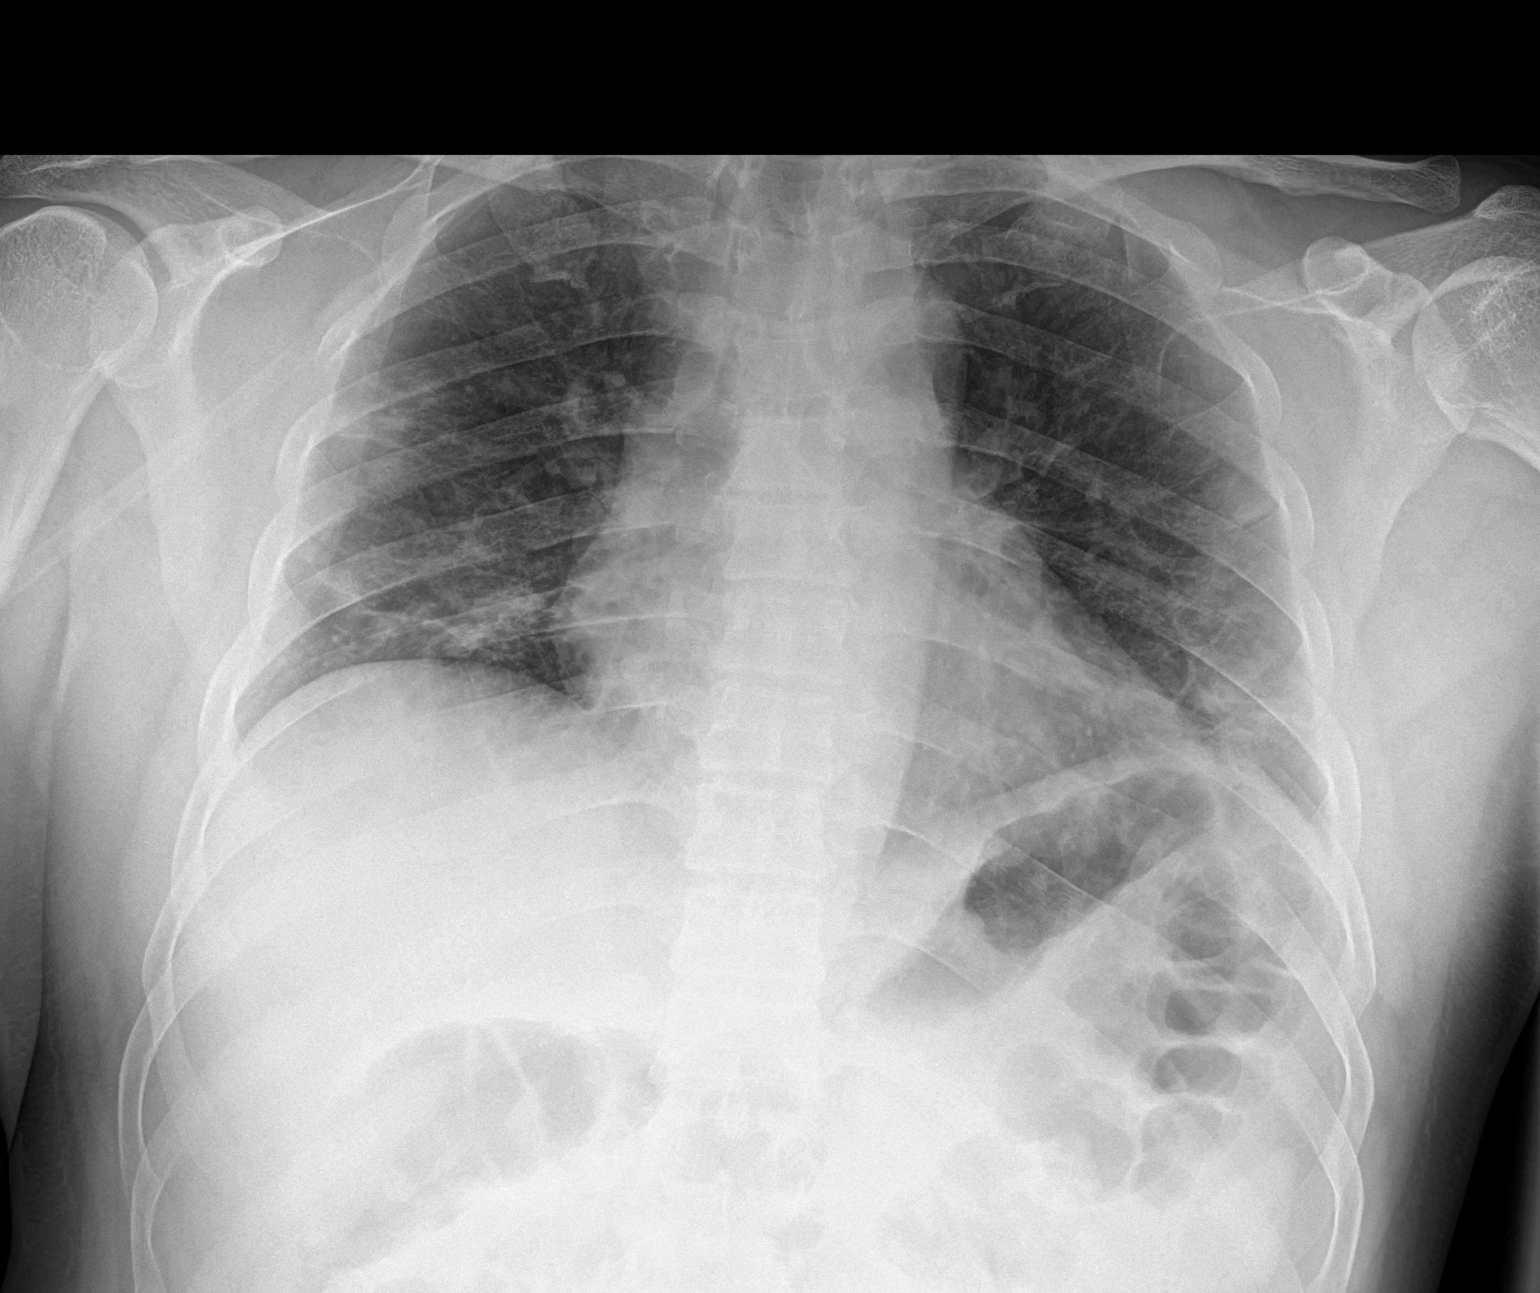

[1 of 1 positions shown; findings below may reference images not displayed]

FINDINGS: Lung volumes are low. Patchy ill-defined airspace opacities in
interstitial prominence throughout the mid to lower lungs
bilaterally, concerning for multilobar pneumonia. No definite
pleural effusions. No evidence of pulmonary edema. Heart size is
normal.
IMPRESSION: 1. Findings concerning for multilobar pneumonia.
